# Patient Record
Sex: Male | Born: 1935 | Race: White | Hispanic: No | Marital: Married
Health system: Southern US, Community
[De-identification: ages and names within clinical notes are randomized; demographics above are authoritative.]

## PROBLEM LIST (undated history)

## (undated) DIAGNOSIS — J90 Pleural effusion, not elsewhere classified: Secondary | ICD-10-CM

## (undated) DIAGNOSIS — N183 Chronic kidney disease, stage 3 unspecified: Secondary | ICD-10-CM

## (undated) DIAGNOSIS — J9621 Acute and chronic respiratory failure with hypoxia: Secondary | ICD-10-CM

## (undated) DIAGNOSIS — I729 Aneurysm of unspecified site: Secondary | ICD-10-CM

## (undated) DIAGNOSIS — I482 Chronic atrial fibrillation, unspecified: Secondary | ICD-10-CM

---

## 2019-09-15 ENCOUNTER — Other Ambulatory Visit (HOSPITAL_COMMUNITY): Payer: Medicare Other

## 2019-09-15 ENCOUNTER — Inpatient Hospital Stay
Admission: AD | Admit: 2019-09-15 | Discharge: 2019-11-01 | Disposition: E | Payer: Medicare Other | Source: Other Acute Inpatient Hospital | Attending: Internal Medicine | Admitting: Internal Medicine

## 2019-09-15 DIAGNOSIS — Z4659 Encounter for fitting and adjustment of other gastrointestinal appliance and device: Secondary | ICD-10-CM

## 2019-09-15 DIAGNOSIS — I729 Aneurysm of unspecified site: Secondary | ICD-10-CM | POA: Diagnosis present

## 2019-09-15 DIAGNOSIS — I482 Chronic atrial fibrillation, unspecified: Secondary | ICD-10-CM | POA: Diagnosis present

## 2019-09-15 DIAGNOSIS — N183 Chronic kidney disease, stage 3 unspecified: Secondary | ICD-10-CM | POA: Diagnosis present

## 2019-09-15 DIAGNOSIS — J189 Pneumonia, unspecified organism: Secondary | ICD-10-CM

## 2019-09-15 DIAGNOSIS — T85598A Other mechanical complication of other gastrointestinal prosthetic devices, implants and grafts, initial encounter: Secondary | ICD-10-CM

## 2019-09-15 DIAGNOSIS — Z0189 Encounter for other specified special examinations: Secondary | ICD-10-CM

## 2019-09-15 DIAGNOSIS — I509 Heart failure, unspecified: Secondary | ICD-10-CM

## 2019-09-15 DIAGNOSIS — R0902 Hypoxemia: Secondary | ICD-10-CM

## 2019-09-15 DIAGNOSIS — T17908A Unspecified foreign body in respiratory tract, part unspecified causing other injury, initial encounter: Secondary | ICD-10-CM

## 2019-09-15 DIAGNOSIS — J90 Pleural effusion, not elsewhere classified: Secondary | ICD-10-CM | POA: Diagnosis present

## 2019-09-15 DIAGNOSIS — J9621 Acute and chronic respiratory failure with hypoxia: Secondary | ICD-10-CM | POA: Diagnosis present

## 2019-09-15 HISTORY — DX: Acute and chronic respiratory failure with hypoxia: J96.21

## 2019-09-15 HISTORY — DX: Chronic kidney disease, stage 3 unspecified: N18.30

## 2019-09-15 HISTORY — DX: Pleural effusion, not elsewhere classified: J90

## 2019-09-15 HISTORY — DX: Chronic atrial fibrillation, unspecified: I48.20

## 2019-09-15 HISTORY — DX: Aneurysm of unspecified site: I72.9

## 2019-09-16 LAB — COMPREHENSIVE METABOLIC PANEL
ALT: 27 U/L (ref 0–44)
AST: 27 U/L (ref 15–41)
Albumin: 1.2 g/dL — ABNORMAL LOW (ref 3.5–5.0)
Alkaline Phosphatase: 70 U/L (ref 38–126)
Anion gap: 7 (ref 5–15)
BUN: 31 mg/dL — ABNORMAL HIGH (ref 8–23)
CO2: 31 mmol/L (ref 22–32)
Calcium: 7.9 mg/dL — ABNORMAL LOW (ref 8.9–10.3)
Chloride: 103 mmol/L (ref 98–111)
Creatinine, Ser: 1.32 mg/dL — ABNORMAL HIGH (ref 0.61–1.24)
GFR calc Af Amer: 55 mL/min — ABNORMAL LOW (ref >60)
GFR calc non Af Amer: 48 mL/min — ABNORMAL LOW (ref >60)
Glucose, Bld: 82 mg/dL (ref 70–99)
Potassium: 4.8 mmol/L (ref 3.5–5.1)
Sodium: 141 mmol/L (ref 135–145)
Total Bilirubin: 0.5 mg/dL (ref 0.3–1.2)
Total Protein: 3.9 g/dL — ABNORMAL LOW (ref 6.5–8.1)

## 2019-09-16 LAB — CBC
HCT: 31.3 % — ABNORMAL LOW (ref 39.0–52.0)
Hemoglobin: 9.5 g/dL — ABNORMAL LOW (ref 13.0–17.0)
MCH: 30.7 pg (ref 26.0–34.0)
MCHC: 30.4 g/dL (ref 30.0–36.0)
MCV: 101.3 fL — ABNORMAL HIGH (ref 80.0–100.0)
Platelets: 166 10*3/uL (ref 150–400)
RBC: 3.09 MIL/uL — ABNORMAL LOW (ref 4.22–5.81)
RDW: 18.4 % — ABNORMAL HIGH (ref 11.5–15.5)
WBC: 5.2 10*3/uL (ref 4.0–10.5)
nRBC: 0 % (ref 0.0–0.2)

## 2019-09-16 NOTE — Consult Note (Signed)
Infectious Disease Consultation   Donald Alvarado  ZOX:[id number]  DOB: 10/11/1928  DOA: 05/22/19  Requesting Donald Sicphysician: Dr.Brown  Reason for consultation: Antibiotic recommendations   History of Present Illness: Patient has dementia, he is a poor historian.  Therefore history obtained from the outside records. Donald Alvarado is an 85 y.o. male with history of dementia, hypertension, atrial fibrillation, hyperlipidemia, hypothyroidism, peripheral arterial disease, bilateral common femoral aneurysms status post prior open repair of the ruptured CFA with PTFE bypass April 16, 2019 who transferred from [facility] to [facility] on 08/07/2019 with right groin bleeding.  Patient was taken to the OR on 08/08/2019 and underwent right groin incision and drainage followed by repeat I&D with wound VAC placement on 08/12/2019.  He was found with AFB positive culture.  He had a hematoma and persistent bleeding from the postoperative wound site.  He was taken to the OR on 08/14/2019 for right groin exploration due to bleeding.  Sartorius flap coverage was done at that time.  Infectious disease was consulted for concern of infected mycotic arterial aneurysm.  He was initially treated with Zosyn and vancomycin.  He also had other symptoms of sore throat and rhinorrhea.  Covid 19 test was negative on admission on 08/07/2019.  Patient was transferred to the ICU on 08/17/2019 following rapid response event for increased bleeding from the right groin surgical site.  He also had atrial fibrillation with RVR.  He was taken back to the OR on 08/18/2019 and underwent redo right external iliac artery to superficial femoral artery bypass with reversed saphenous vein and jump graft from femoral vein to SFA with reversed right greater saphenous vein.  He received multiple blood product transfusion for the acute blood loss anemia.  Regarding his A. fib with RVR cardiology was consulted and he was transitioned from amiodarone  drip to oral.  Patient also had dysphagia.  He reportedly failed multiple modified barium swallow studies and Dobbhoff tube was placed.  He developed left-sided pleural effusion requiring thoracentesis on 09/05/2019.  He is on oxygen by nasal cannula.  He also had fluid overload and diuresed with Lasix which was later stopped due to acute kidney injury.  Palliative care was consulted.  Family decided against placement of permanent feeding tube.  Patient has been on treatment with azithromycin, cefoxitin, tigecycline.  He has dementia, oriented x1.  Denies having any chest pain, shortness of breath, fevers, chills, nausea, vomiting, abdominal pain, diarrhea or dysuria at this time.   Review of Systems:  Review of system negative except as mentioned above in the HPI   Past Medical History: Hypertension, peripheral vascular disease, dementia, hypothyroidism  Past Surgical History: Open repair of right groin ruptured CFA with PTFE bypass April 16, 2019, subsequent incision and drainage on 08/08/2019, 08/12/2019, 08/14/2019.  Allergies: Please see MAR  Social History: Unable to obtain at this time  Family History: Unable to obtain at this time   Physical Exam: Vitals: Temperature 97.9, pulse 57, respiratory rate 20, blood pressure 165/83, pulse oximetry 92% on oxygen by nasal cannula General: Frail, ill-appearing male, awake, oriented x1 Head: Atraumatic, normocephalic Eyes: PERLA, EOMI, irises appear normal, anicteric sclera,  ENMT: external ears and nose appear normal, hard of hearing, no oral, ear or nose lesions, Dobbhoff tube in place            Neck: neck appears normal, no masses  CVS: S1-S2, no murmur  Respiratory: Decreased breath sound lower lobes, occasional rhonchi, no wheezing Abdomen: soft nontender, nondistended,  normal bowel sounds  Musculoskeletal: Healing surgical incision in the right groin area, has some swelling in the area, mild erythema, no tenderness, no drainage  noted at this time Neuro: He has history of dementia, oriented x1, able to move all his extremities, has some debility with generalized weakness Psych: Has dementia, stable mood and affect Skin: Findings as mentioned above, no rashes  Data reviewed:  I have personally reviewed following labs and imaging studies Labs:  CBC: Recent Labs  Lab 09/16/19 0507  WBC 5.2  HGB 9.5*  HCT 31.3*  MCV 101.3*  PLT 166    Basic Metabolic Panel: Recent Labs  Lab 09/16/19 0507  NA 141  K 4.8  CL 103  CO2 31  GLUCOSE 82  BUN 31*  CREATININE 1.32*  CALCIUM 7.9*   GFR CrCl cannot be calculated (Unknown ideal weight.). Liver Function Tests: Recent Labs  Lab 09/16/19 0507  AST 27  ALT 27  ALKPHOS 70  BILITOT 0.5  PROT 3.9*  ALBUMIN 1.2*   No results for input(s): LIPASE, AMYLASE in the last 168 hours. No results for input(s): AMMONIA in the last 168 hours. Coagulation profile No results for input(s): INR, PROTIME in the last 168 hours.  Cardiac Enzymes: No results for input(s): CKTOTAL, CKMB, CKMBINDEX, TROPONINI in the last 168 hours. BNP: Invalid input(s): POCBNP CBG: No results for input(s): GLUCAP in the last 168 hours. D-Dimer No results for input(s): DDIMER in the last 72 hours. Hgb A1c No results for input(s): HGBA1C in the last 72 hours. Lipid Profile No results for input(s): CHOL, HDL, LDLCALC, TRIG, CHOLHDL, LDLDIRECT in the last 72 hours. Thyroid function studies No results for input(s): TSH, T4TOTAL, T3FREE, THYROIDAB in the last 72 hours.  Invalid input(s): FREET3 Anemia work up No results for input(s): VITAMINB12, FOLATE, FERRITIN, TIBC, IRON, RETICCTPCT in the last 72 hours. Urinalysis No results found for: COLORURINE, APPEARANCEUR, LABSPEC, PHURINE, GLUCOSEU, HGBUR, BILIRUBINUR, KETONESUR, PROTEINUR, UROBILINOGEN, NITRITE, LEUKOCYTESUR   Microbiology No results found for this or any previous visit (from the past 240 hour(s)).     Inpatient  Medications:   Scheduled Meds: Continuous Infusions:   Radiological Exams on Admission: DG Abd 1 View  Result Date: 09/06/2019 CLINICAL DATA:  NG tube placement EXAM: ABDOMEN - 1 VIEW COMPARISON:  None. FINDINGS: Weighted feeding tube terminates in the mid gastric body. Nonobstructive bowel gas pattern. Small bilateral pleural effusions, left greater than right. IMPRESSION: Weighted feeding tube terminates in the mid gastric body. Electronically Signed   By: Charline Bills M.D.   On: 09/19/2019 20:27   DG Chest Port 1 View  Result Date: 09/29/2019 CLINICAL DATA:  Pleural effusion EXAM: PORTABLE CHEST 1 VIEW COMPARISON:  None FINDINGS: Cardiomegaly. Bilateral pleural effusions, moderate, left greater than right. Bibasilar atelectasis. Mild vascular congestion. No acute bony abnormality. Aortic atherosclerosis. IMPRESSION: Moderate bilateral pleural effusions, left greater than right with bibasilar atelectasis. Cardiomegaly, vascular congestion. Electronically Signed   By: Charlett Nose M.D.   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  He is status post multiple surgeries on 08/08/2019, 08/12/2019 and subsequently again on 08/14/2019.  Wound cultures showed Mycobacterium abscessus.  Currently the wound is closed, incision healing. On treatment with azithromycin, cefoxitin, tigecycline.   Continue current IV antibiotics.  He will need treatment for at least 2 weeks if not 3 weeks depending on improvement. Continue local wound care.  If he starts having any worsening pain or tenderness recommend CT imaging of the area.  Right groin abscess with infection: Status post multiple debridement surgeries as mentioned above.  Cultures reportedly showed Mycobacterium abscesses.  He is status post multiple debridements.  Antibiotics and plan as mentioned above.  Monitor BUN/creatinine closely while on antibiotics.  Infected mycotic aneurysm: As mentioned above he is status post multiple surgical debridements.  He was already treated with broad-spectrum IV vancomycin, Zosyn.  Cultures at the outside facility showed Mycobacterium abscesses.  Therefore on antibiotics as mentioned above.  Acute respiratory failure: This is postoperative.  Likely also has aspiration secondary to his dysphagia?  Chest x-ray from here showing moderate bilateral pleural effusions left greater than right with bibasilar atelectasis, cardiomegaly with vascular congestion.  Currently on oxygen by nasal cannula.  Denies having any cough or sputum production.  Management of vascular congestion and pleural effusion per the primary team.  Severe peripheral vascular disease: Continue medication and management per primary team.  Dysphagia: Patient failed swallow evaluation at the outside facility.  Currently has a Dobbhoff tube.  Unfortunately due to his dysphagia he is very high risk for ongoing aspiration and worsening respiratory failure from aspiration pneumonia.  Left-sided effusion: Patient already had thoracentesis at the outside facility.  Chest x-ray from here showing moderate bilateral pleural effusions left greater than right with atelectasis.  Suggest incentive spirometry with the patient able to tolerate.  If his respiratory status worsens would recommend to obtain chest imaging preferably chest CT without contrast to  better evaluate.  Further management of the pleural effusion per the primary team.  Atrial fibrillation: Continue medication and management per primary team.  Chronic kidney disease: Patient likely has stage III CKD.  He had acute renal insufficiency at the outside facility.  Please monitor BUN/trending closely while on antibiotics.  Avoid nephrotoxic medications.  Dementia/debility: Continue supportive therapy per the primary team.  Due to his complex medical problems he is high risk for worsening and decompensation.   Thank you for this consultation.   Yaakov Guthrie M.D. 09/16/2019, 2:46 PM

## 2019-09-20 LAB — BASIC METABOLIC PANEL
Anion gap: 7 (ref 5–15)
BUN: 38 mg/dL — ABNORMAL HIGH (ref 8–23)
CO2: 32 mmol/L (ref 22–32)
Calcium: 8.1 mg/dL — ABNORMAL LOW (ref 8.9–10.3)
Chloride: 103 mmol/L (ref 98–111)
Creatinine, Ser: 1.35 mg/dL — ABNORMAL HIGH (ref 0.61–1.24)
GFR calc Af Amer: 54 mL/min — ABNORMAL LOW (ref >60)
GFR calc non Af Amer: 47 mL/min — ABNORMAL LOW (ref >60)
Glucose, Bld: 100 mg/dL — ABNORMAL HIGH (ref 70–99)
Potassium: 5 mmol/L (ref 3.5–5.1)
Sodium: 142 mmol/L (ref 135–145)

## 2019-09-21 LAB — CBC
HCT: 31.5 % — ABNORMAL LOW (ref 39.0–52.0)
Hemoglobin: 9.9 g/dL — ABNORMAL LOW (ref 13.0–17.0)
MCH: 31.3 pg (ref 26.0–34.0)
MCHC: 31.4 g/dL (ref 30.0–36.0)
MCV: 99.7 fL (ref 80.0–100.0)
Platelets: 160 10*3/uL (ref 150–400)
RBC: 3.16 MIL/uL — ABNORMAL LOW (ref 4.22–5.81)
RDW: 18.3 % — ABNORMAL HIGH (ref 11.5–15.5)
WBC: 5.9 10*3/uL (ref 4.0–10.5)
nRBC: 0 % (ref 0.0–0.2)

## 2019-09-21 LAB — RENAL FUNCTION PANEL
Albumin: 1.3 g/dL — ABNORMAL LOW (ref 3.5–5.0)
Anion gap: 7 (ref 5–15)
BUN: 35 mg/dL — ABNORMAL HIGH (ref 8–23)
CO2: 32 mmol/L (ref 22–32)
Calcium: 8.1 mg/dL — ABNORMAL LOW (ref 8.9–10.3)
Chloride: 102 mmol/L (ref 98–111)
Creatinine, Ser: 1.16 mg/dL (ref 0.61–1.24)
GFR calc Af Amer: >60 mL/min (ref >60)
GFR calc non Af Amer: 56 mL/min — ABNORMAL LOW (ref >60)
Glucose, Bld: 98 mg/dL (ref 70–99)
Phosphorus: 3.6 mg/dL (ref 2.5–4.6)
Potassium: 4.5 mmol/L (ref 3.5–5.1)
Sodium: 141 mmol/L (ref 135–145)

## 2019-09-21 LAB — MAGNESIUM: Magnesium: 1.9 mg/dL (ref 1.7–2.4)

## 2019-09-22 NOTE — Progress Notes (Signed)
PROGRESS NOTE    Donald Alvarado  DQQ:[id number] DOB: [birth year] DOA: 2019/10/09  Brief Narrative:  Donald Alvarado is an 85 y.o. male with history of dementia, hypertension, atrial fibrillation, hyperlipidemia, hypothyroidism, peripheral arterial disease, bilateral common femoral aneurysms status post prior open repair of the ruptured CFA with PTFE bypass April 16, 2019 who transferred from [facility] to [facility] on 08/07/2019 with right groin bleeding.  Patient was taken to the OR on 08/08/2019 and underwent right groin incision and drainage followed by repeat I&D with wound VAC placement on 08/12/2019.  He was found with AFB positive culture.  He had a hematoma and persistent bleeding from the postoperative wound site.  He was taken to the OR on 08/14/2019 for right groin exploration due to bleeding.  Sartorius flap coverage was done at that time.  Infectious disease was consulted for concern of infected mycotic arterial aneurysm.  He was initially treated with Zosyn and vancomycin.  He also had other symptoms of sore throat and rhinorrhea.  Covid 19 test was negative on admission on 08/07/2019.  Patient was transferred to the ICU on 08/17/2019 following rapid response event for increased bleeding from the right groin surgical site.  He also had atrial fibrillation with RVR.  He was taken back to the OR on 08/18/2019 and underwent redo right external iliac artery to superficial femoral artery bypass with reversed saphenous vein and jump graft from femoral vein to SFA with reversed right greater saphenous vein.  He received multiple blood product transfusion for the acute blood loss anemia.  Regarding his A. fib with RVR cardiology was consulted and he was transitioned from amiodarone drip to oral.  Patient also had dysphagia.  He reportedly failed multiple modified barium swallow studies and Dobbhoff tube was placed.  He developed left-sided pleural effusion requiring thoracentesis on 09/05/2019.  He is on  oxygen by nasal cannula.  He also had fluid overload and diuresed with Lasix which was later stopped due to acute kidney injury.  Palliative care was consulted.  Family decided against placement of permanent feeding tube.  Patient on treatment with azithromycin, cefoxitin, tigecycline.    09/22/2019: He has dementia, currently oriented x2.  Denies having any chest pain, shortness of breath, fevers, chills, nausea, vomiting, abdominal pain, diarrhea or dysuria at this time.   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  He is status post multiple surgeries on 08/08/2019, 08/12/2019 and subsequently again on 08/14/2019.  Wound cultures showed Mycobacterium abscessus.  Currently the wound is closed, incision healing. On treatment with azithromycin, cefoxitin, tigecycline.   09/22/2019: His surgical incision is healing, he has a scab but on palpation he has mild purulence from around the scab.  Therefore recommend to continue current IV antibiotics.  He will need treatment for at least 2 weeks if not 3 weeks depending on improvement. Continue local wound care.  If he starts having any worsening pain or tenderness recommend CT imaging of the area.  Right groin abscess with infection: Status post multiple debridement surgeries  as mentioned above.  Cultures reportedly showed Mycobacterium abscesses.  He is status post multiple debridements.   Antibiotics and plan as mentioned above.  Monitor BUN/creatinine closely while on antibiotics.  Infected mycotic aneurysm: As mentioned above he is status post multiple surgical debridements.  He was already treated with broad-spectrum IV vancomycin, Zosyn.  Cultures at the outside facility showed Mycobacterium abscesses.  Therefore on antibiotics as mentioned above.  Acute respiratory failure: This is postoperative.  Likely also has aspiration secondary to his dysphagia?  Chest x-ray from here showing moderate bilateral pleural effusions left greater than right with bibasilar atelectasis, cardiomegaly with vascular congestion.  Currently on oxygen by nasal cannula.  Denies having any cough or sputum production.  Management of vascular congestion and pleural effusion per the primary team.  Severe peripheral vascular disease: Continue medication and management per primary team.  Dysphagia: Patient failed swallow evaluation at the outside facility.  Currently has a Dobbhoff tube.  Unfortunately due to his dysphagia he is very high risk for ongoing aspiration and worsening respiratory failure from aspiration pneumonia.  Left-sided effusion: Patient already had thoracentesis at the outside facility.  Chest x-ray from here showing moderate bilateral pleural effusions left greater than right with atelectasis.  Suggest incentive spirometry with the patient able to tolerate.  If his respiratory status worsens, would recommend to obtain chest imaging preferably chest CT without contrast to better evaluate.  Further management of the pleural effusion per the primary team.  Atrial fibrillation: Continue medication and management per primary team.  Chronic kidney disease: Patient likely has stage III CKD.  He had acute renal insufficiency at the outside facility.  Please monitor BUN/trending closely while on antibiotics.  Avoid nephrotoxic medications.  Dementia/debility: Continue supportive therapy per  the primary team.  Bradycardia:  Continue medications and management per the primary team.  Due to his complex medical problems he is high risk for worsening and decompensation.  Subjective: He denies having any major complaints at this time.  His lower extremity surgical site still a little bit swollen, has scab but mild purulence on palpation.  Continues with the Dobbhoff tube.  Objective: Temperature 96.8, pulse 51, respiratory 19, blood pressure 201/84, pulse oximetry 98% on oxygen by nasal cannula.  Examination:  General: Frail, ill-appearing male, awake, oriented x2 Head: Atraumatic, normocephalic Eyes: PERLA, EOMI, irises appear normal, anicteric sclera,  ENMT: external ears and nose appear normal, hard of hearing, no oral, ear or nose lesions, Dobbhoff tube in place            Neck: neck appears normal, no masses  CVS: S1-S2, no murmur  Respiratory: Decreased breath sound lower lobes, occasional rhonchi, no wheezing Abdomen: soft nontender, nondistended, normal bowel sounds  Musculoskeletal: Healing surgical incision in the right groin area with a scab.  On palpation he has mild purulence coming from around the scab. has some swelling in the area, no erythema, no tenderness Neuro: He has history of dementia, oriented x1, able to move all his extremities, has some debility with generalized weakness, lower extremity edema. Psych: Has dementia, stable mood and affect Skin: Findings as mentioned above, no rashes   Data Reviewed: I have personally reviewed following labs and imaging studies  CBC: Recent Labs  Lab 09/16/19 0507 09/21/19 0515  WBC 5.2 5.9  HGB 9.5* 9.9*  HCT 31.3* 31.5*  MCV 101.3* 99.7  PLT 166 354   Basic Metabolic Panel: Recent Labs  Lab 09/16/19 0507 09/20/19 0717 09/21/19 0515  NA 141 142  141  K 4.8 5.0 4.5  CL 103 103 102  CO2 31 32 32  GLUCOSE 82 100* 98  BUN 31* 38* 35*  CREATININE 1.32* 1.35* 1.16  CALCIUM 7.9* 8.1* 8.1*  MG  --   --   1.9  PHOS  --   --  3.6   GFR: CrCl cannot be calculated (Unknown ideal weight.). Liver Function Tests: Recent Labs  Lab 09/16/19 0507 09/21/19 0515  AST 27  --   ALT 27  --   ALKPHOS 70  --   BILITOT 0.5  --   PROT 3.9*  --   ALBUMIN 1.2* 1.3*   No results for input(s): LIPASE, AMYLASE in the last 168 hours. No results for input(s): AMMONIA in the last 168 hours. Coagulation Profile: No results for input(s): INR, PROTIME in the last 168 hours. Cardiac Enzymes: No results for input(s): CKTOTAL, CKMB, CKMBINDEX, TROPONINI in the last 168 hours. BNP (last 3 results) No results for input(s): PROBNP in the last 8760 hours. HbA1C: No results for input(s): HGBA1C in the last 72 hours. CBG: No results for input(s): GLUCAP in the last 168 hours. Lipid Profile: No results for input(s): CHOL, HDL, LDLCALC, TRIG, CHOLHDL, LDLDIRECT in the last 72 hours. Thyroid Function Tests: No results for input(s): TSH, T4TOTAL, FREET4, T3FREE, THYROIDAB in the last 72 hours. Anemia Panel: No results for input(s): VITAMINB12, FOLATE, FERRITIN, TIBC, IRON, RETICCTPCT in the last 72 hours. Sepsis Labs: No results for input(s): PROCALCITON, LATICACIDVEN in the last 168 hours.  No results found for this or any previous visit (from the past 240 hour(s)).       Radiology Studies: No results found.      Scheduled Meds: Please see MAR   Vonzella NippleAnupama Gabriell Daigneault, MD 09/22/2019, 12:35 PM

## 2019-09-23 ENCOUNTER — Other Ambulatory Visit (HOSPITAL_COMMUNITY): Payer: Medicare Other

## 2019-09-25 LAB — RENAL FUNCTION PANEL
Albumin: 1.4 g/dL — ABNORMAL LOW (ref 3.5–5.0)
Anion gap: 6 (ref 5–15)
BUN: 42 mg/dL — ABNORMAL HIGH (ref 8–23)
CO2: 31 mmol/L (ref 22–32)
Calcium: 8.1 mg/dL — ABNORMAL LOW (ref 8.9–10.3)
Chloride: 102 mmol/L (ref 98–111)
Creatinine, Ser: 1.38 mg/dL — ABNORMAL HIGH (ref 0.61–1.24)
GFR calc Af Amer: 53 mL/min — ABNORMAL LOW (ref >60)
GFR calc non Af Amer: 45 mL/min — ABNORMAL LOW (ref >60)
Glucose, Bld: 113 mg/dL — ABNORMAL HIGH (ref 70–99)
Phosphorus: 3.5 mg/dL (ref 2.5–4.6)
Potassium: 4.4 mmol/L (ref 3.5–5.1)
Sodium: 139 mmol/L (ref 135–145)

## 2019-09-25 LAB — CBC
HCT: 32.3 % — ABNORMAL LOW (ref 39.0–52.0)
Hemoglobin: 10.4 g/dL — ABNORMAL LOW (ref 13.0–17.0)
MCH: 31.3 pg (ref 26.0–34.0)
MCHC: 32.2 g/dL (ref 30.0–36.0)
MCV: 97.3 fL (ref 80.0–100.0)
Platelets: 180 10*3/uL (ref 150–400)
RBC: 3.32 MIL/uL — ABNORMAL LOW (ref 4.22–5.81)
RDW: 18.4 % — ABNORMAL HIGH (ref 11.5–15.5)
WBC: 6.8 10*3/uL (ref 4.0–10.5)
nRBC: 0 % (ref 0.0–0.2)

## 2019-09-25 LAB — MAGNESIUM: Magnesium: 2 mg/dL (ref 1.7–2.4)

## 2019-09-26 ENCOUNTER — Other Ambulatory Visit (HOSPITAL_COMMUNITY): Payer: Medicare Other

## 2019-09-27 ENCOUNTER — Other Ambulatory Visit (HOSPITAL_COMMUNITY): Payer: Medicare Other

## 2019-09-28 LAB — CBC
HCT: 34.7 % — ABNORMAL LOW (ref 39.0–52.0)
Hemoglobin: 11 g/dL — ABNORMAL LOW (ref 13.0–17.0)
MCH: 31.6 pg (ref 26.0–34.0)
MCHC: 31.7 g/dL (ref 30.0–36.0)
MCV: 99.7 fL (ref 80.0–100.0)
Platelets: 172 10*3/uL (ref 150–400)
RBC: 3.48 MIL/uL — ABNORMAL LOW (ref 4.22–5.81)
RDW: 18.5 % — ABNORMAL HIGH (ref 11.5–15.5)
WBC: 5.9 10*3/uL (ref 4.0–10.5)
nRBC: 0 % (ref 0.0–0.2)

## 2019-09-28 LAB — BASIC METABOLIC PANEL
Anion gap: 7 (ref 5–15)
BUN: 43 mg/dL — ABNORMAL HIGH (ref 8–23)
CO2: 32 mmol/L (ref 22–32)
Calcium: 8.4 mg/dL — ABNORMAL LOW (ref 8.9–10.3)
Chloride: 101 mmol/L (ref 98–111)
Creatinine, Ser: 1.37 mg/dL — ABNORMAL HIGH (ref 0.61–1.24)
GFR calc Af Amer: 53 mL/min — ABNORMAL LOW (ref >60)
GFR calc non Af Amer: 46 mL/min — ABNORMAL LOW (ref >60)
Glucose, Bld: 107 mg/dL — ABNORMAL HIGH (ref 70–99)
Potassium: 4.7 mmol/L (ref 3.5–5.1)
Sodium: 140 mmol/L (ref 135–145)

## 2019-09-28 LAB — MAGNESIUM: Magnesium: 1.9 mg/dL (ref 1.7–2.4)

## 2019-09-30 ENCOUNTER — Other Ambulatory Visit (HOSPITAL_COMMUNITY): Payer: Medicare Other

## 2019-09-30 NOTE — Progress Notes (Signed)
PROGRESS NOTE    Donald Alvarado  ZTI:[id number] DOB: [birth year] DOA: 09/10/2019   Brief Narrative:  Donald Alvarado an 85 y.o.malewith history of dementia, hypertension, atrial fibrillation, hyperlipidemia, hypothyroidism, peripheral arterial disease, bilateral common femoral aneurysms status post prior open repair of the ruptured CFA with PTFE bypass April 16, 2019 who transferred from [facility] to [facility] on 08/07/2019 with right groin bleeding. Patient was taken to the OR on 08/08/2019 and underwent right groin incision and drainage followed by repeat I&D with wound VAC placement on 08/12/2019. He was found with AFB positive culture. He had a hematoma and persistent bleeding from the postoperative wound site. He was taken to the OR on 08/14/2019 for right groin exploration due to bleeding. Sartorius flap coverage was done at that time. Infectious disease was consulted for concern of infected mycotic arterial aneurysm. He was initially treated with Zosyn and vancomycin. He also had other symptoms of sore throat and rhinorrhea. Covid 19 test was negative on admission on 08/07/2019. Patient was transferred to the ICU on 08/17/2019 following rapid response event for increased bleeding from the right groin surgical site. He also had atrial fibrillation with RVR. He was taken back to the OR on 08/18/2019 and underwent redo right external iliac artery to superficial femoral artery bypass with reversed saphenous vein and jump graft from femoral vein to SFA with reversed right greater saphenous vein. He received multiple blood product transfusion for the acute blood loss anemia. Regarding his A. fib with RVR cardiology was consulted and he was transitioned from amiodarone drip to oral. Patient also had dysphagia. He reportedly failed multiple modified barium swallow studies and Dobbhoff tube was placed. He developed left-sided pleural effusion requiring thoracentesis on 09/05/2019. He is on  oxygen by nasal cannula. He also had fluid overload and diuresed with Lasix which was later stopped due to acute kidney injury. Palliative care was consulted. Family decided against placement of permanent feeding tube. Patient on treatment with azithromycin, cefoxitin, tigecycline.   09/30/2019: He is confused.  He has history of dementia.  He was started on diet after speech/swallow evaluation.  However, today noted to have coughing, gurgling sounds.  Likely aspirating?  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 He is status post multiple surgeries on 08/08/2019, 08/12/2019 and subsequently again on 08/14/2019. Wound cultures showed Mycobacterium abscessus. Currently the wound is closed. On treatment with azithromycin, cefoxitin, tigecycline.  09/30/2019: His surgical incision is healing, previously had a scab.  Currently he has dressing in place with some mild drainage.  Recommend to continue current IV antibiotics.  We will plan to treat for another week and reevaluate the wound depending on improvement. Continue local wound care.  He is having some swelling on the medial aspect of the same thigh likely secondary to hematoma.  However, nontender at this time. If he starts having any worsening pain or tenderness recommend either ultrasound or CT imaging of the area.  Right groin abscess with infection: Status post multiple  debridement surgeries as mentioned above. Cultures reportedly  showed Mycobacterium abscesses. He is status post multiple debridements. Antibiotics and plan as mentioned above. Monitor BUN/creatinine closely while on antibiotics.  Infected mycotic aneurysm: As mentioned above he is status post multiple surgical debridements. He was already treated with broad-spectrum IV vancomycin, Zosyn. Cultures at the outside facility showed Mycobacterium abscesses. Therefore on antibiotics as mentioned above.  Acute respiratory failure: Likely also has aspiration secondary to his dysphagia? Previous chest x-ray showed moderate bilateral pleural effusions left greater than right with bibasilar atelectasis, cardiomegaly with vascular congestion. Management of vascular congestion and pleural effusion per the primary team.  Severe peripheral vascular disease: Continue medication and management per primary team.  Dysphagia: Patient failed swallow evaluation at the outside facility.  Previously had Dobbhoff tube.  After swallow evaluation here he was started on diet.  However, today noted to have some cough and gurgling.  Very high suspicion for aspiration.  Chest x-ray being ordered. Unfortunately due to his dysphagia he is very high risk for ongoing aspiration and worsening respiratory failure from aspiration pneumonia.  Left-sided effusion: Patient already had thoracentesis at the outside facility.  Previous chest x-ray from here showed moderate bilateral pleural effusions left greater than right with atelectasis.  Repeat chest x-ray being ordered.  Atrial fibrillation: Continue medication and management per primary team.  Chronic kidney disease: Patient likely has stage III CKD. He had acute renal insufficiency at the outside facility. Please monitor BUN/trending closely while on antibiotics. Avoid nephrotoxic medications.  Dementia/debility: Continue supportive therapy per the primary  team.  Bradycardia:  Continue medications and management per the primary team.  Due to his complex medical problems he is high risk for worsening and decompensation.  Subjective: He is confused.  Having some cough/gurgling.  Concern for aspiration.  Objective: Temperature 97.3, pulse 85, respiratory 20, blood pressure 151/18, pulse oximetry 95%  Examination: General:Frail, ill-appearing male, awake, oriented x2 Head:Atraumatic, normocephalic Eyes: PERLA, EOMI, irises appear normal, anicteric sclera,  ENMT: external ears and nose appear normal,hard of hearing, nooral,ear or nose lesions, Dobbhoff tube in place Neck:neck appears normal, no masses  CVS: S1-S2, no murmur Respiratory:Decreased breath sound lower lobes, left greater than right, rhonchi, no wheezing Abdomen: soft nontender, nondistended, normal bowel sounds  Musculoskeletal:Healing surgical incision in the right groin area with mild drainage.  has some swelling on the medial aspect, no erythema, no tenderness Neuro:He has history of dementia, able to move all his extremities, has some debility with generalized weakness, lower extremity edema. Psych:Has dementia, stable mood and affect Skin:Findings as mentioned above, no rashes    Data Reviewed: I have personally reviewed following labs and imaging studies  CBC: Recent Labs  Lab 09/25/19 0859 09/28/19 1004  WBC 6.8 5.9  HGB 10.4* 11.0*  HCT 32.3* 34.7*  MCV 97.3 99.7  PLT 180 818   Basic Metabolic Panel: Recent Labs  Lab 09/25/19 0859 09/28/19 1004  NA 139 140  K 4.4 4.7  CL 102 101  CO2 31 32  GLUCOSE 113* 107*  BUN 42* 43*  CREATININE 1.38* 1.37*  CALCIUM 8.1* 8.4*  MG 2.0 1.9  PHOS 3.5  --    GFR: CrCl cannot be calculated (Unknown ideal weight.). Liver Function Tests: Recent Labs  Lab 09/25/19 0859  ALBUMIN 1.4*   No results for input(s): LIPASE, AMYLASE in the last 168 hours. No results for input(s): AMMONIA in  the last 168 hours. Coagulation Profile: No results for input(s): INR, PROTIME in the last 168 hours. Cardiac Enzymes: No results for input(s): CKTOTAL,  CKMB, CKMBINDEX, TROPONINI in the last 168 hours. BNP (last 3 results) No results for input(s): PROBNP in the last 8760 hours. HbA1C: No results for input(s): HGBA1C in the last 72 hours. CBG: No results for input(s): GLUCAP in the last 168 hours. Lipid Profile: No results for input(s): CHOL, HDL, LDLCALC, TRIG, CHOLHDL, LDLDIRECT in the last 72 hours. Thyroid Function Tests: No results for input(s): TSH, T4TOTAL, FREET4, T3FREE, THYROIDAB in the last 72 hours. Anemia Panel: No results for input(s): VITAMINB12, FOLATE, FERRITIN, TIBC, IRON, RETICCTPCT in the last 72 hours. Sepsis Labs: No results for input(s): PROCALCITON, LATICACIDVEN in the last 168 hours.  No results found for this or any previous visit (from the past 240 hour(s)).       Radiology Studies: No results found.      Scheduled Meds: Please see MAR    Vonzella NippleAnupama Zyire Eidson, MD 09/30/2019, 12:26 PM

## 2019-10-01 LAB — URINALYSIS, ROUTINE W REFLEX MICROSCOPIC
Bilirubin Urine: NEGATIVE
Glucose, UA: NEGATIVE mg/dL
Ketones, ur: 5 mg/dL — AB
Leukocytes,Ua: NEGATIVE
Nitrite: NEGATIVE
Protein, ur: 30 mg/dL — AB
Specific Gravity, Urine: 1.017 (ref 1.005–1.030)
pH: 5 (ref 5.0–8.0)

## 2019-10-01 LAB — CBC
HCT: 34 % — ABNORMAL LOW (ref 39.0–52.0)
Hemoglobin: 10.9 g/dL — ABNORMAL LOW (ref 13.0–17.0)
MCH: 31.2 pg (ref 26.0–34.0)
MCHC: 32.1 g/dL (ref 30.0–36.0)
MCV: 97.4 fL (ref 80.0–100.0)
Platelets: 161 10*3/uL (ref 150–400)
RBC: 3.49 MIL/uL — ABNORMAL LOW (ref 4.22–5.81)
RDW: 18 % — ABNORMAL HIGH (ref 11.5–15.5)
WBC: 15.6 10*3/uL — ABNORMAL HIGH (ref 4.0–10.5)
nRBC: 0 % (ref 0.0–0.2)

## 2019-10-01 LAB — MAGNESIUM: Magnesium: 1.8 mg/dL (ref 1.7–2.4)

## 2019-10-01 LAB — BASIC METABOLIC PANEL
Anion gap: 11 (ref 5–15)
BUN: 38 mg/dL — ABNORMAL HIGH (ref 8–23)
CO2: 28 mmol/L (ref 22–32)
Calcium: 8 mg/dL — ABNORMAL LOW (ref 8.9–10.3)
Chloride: 104 mmol/L (ref 98–111)
Creatinine, Ser: 1.63 mg/dL — ABNORMAL HIGH (ref 0.61–1.24)
GFR calc Af Amer: 43 mL/min — ABNORMAL LOW (ref >60)
GFR calc non Af Amer: 37 mL/min — ABNORMAL LOW (ref >60)
Glucose, Bld: 77 mg/dL (ref 70–99)
Potassium: 3.9 mmol/L (ref 3.5–5.1)
Sodium: 143 mmol/L (ref 135–145)

## 2019-10-01 DEATH — deceased

## 2019-10-02 ENCOUNTER — Other Ambulatory Visit (HOSPITAL_COMMUNITY): Payer: Medicare Other

## 2019-10-02 LAB — BASIC METABOLIC PANEL
Anion gap: 8 (ref 5–15)
BUN: 38 mg/dL — ABNORMAL HIGH (ref 8–23)
CO2: 29 mmol/L (ref 22–32)
Calcium: 7.9 mg/dL — ABNORMAL LOW (ref 8.9–10.3)
Chloride: 106 mmol/L (ref 98–111)
Creatinine, Ser: 1.41 mg/dL — ABNORMAL HIGH (ref 0.61–1.24)
GFR calc Af Amer: 51 mL/min — ABNORMAL LOW (ref >60)
GFR calc non Af Amer: 44 mL/min — ABNORMAL LOW (ref >60)
Glucose, Bld: 87 mg/dL (ref 70–99)
Potassium: 3.9 mmol/L (ref 3.5–5.1)
Sodium: 143 mmol/L (ref 135–145)

## 2019-10-02 LAB — URINE CULTURE: Culture: NO GROWTH

## 2019-10-04 ENCOUNTER — Encounter: Payer: Self-pay | Admitting: Internal Medicine

## 2019-10-04 ENCOUNTER — Other Ambulatory Visit (HOSPITAL_COMMUNITY): Payer: Medicare Other

## 2019-10-04 DIAGNOSIS — J90 Pleural effusion, not elsewhere classified: Secondary | ICD-10-CM

## 2019-10-04 DIAGNOSIS — I482 Chronic atrial fibrillation, unspecified: Secondary | ICD-10-CM

## 2019-10-04 DIAGNOSIS — J9621 Acute and chronic respiratory failure with hypoxia: Secondary | ICD-10-CM | POA: Diagnosis not present

## 2019-10-04 DIAGNOSIS — I729 Aneurysm of unspecified site: Secondary | ICD-10-CM

## 2019-10-04 DIAGNOSIS — N183 Chronic kidney disease, stage 3 unspecified: Secondary | ICD-10-CM

## 2019-10-04 DIAGNOSIS — I5089 Other heart failure: Secondary | ICD-10-CM

## 2019-10-04 LAB — BASIC METABOLIC PANEL
Anion gap: 8 (ref 5–15)
BUN: 37 mg/dL — ABNORMAL HIGH (ref 8–23)
CO2: 30 mmol/L (ref 22–32)
Calcium: 7.5 mg/dL — ABNORMAL LOW (ref 8.9–10.3)
Chloride: 106 mmol/L (ref 98–111)
Creatinine, Ser: 1.55 mg/dL — ABNORMAL HIGH (ref 0.61–1.24)
GFR calc Af Amer: 46 mL/min — ABNORMAL LOW (ref >60)
GFR calc non Af Amer: 39 mL/min — ABNORMAL LOW (ref >60)
Glucose, Bld: 87 mg/dL (ref 70–99)
Potassium: 3.2 mmol/L — ABNORMAL LOW (ref 3.5–5.1)
Sodium: 144 mmol/L (ref 135–145)

## 2019-10-04 LAB — CBC
HCT: 34.8 % — ABNORMAL LOW (ref 39.0–52.0)
Hemoglobin: 11.2 g/dL — ABNORMAL LOW (ref 13.0–17.0)
MCH: 31.6 pg (ref 26.0–34.0)
MCHC: 32.2 g/dL (ref 30.0–36.0)
MCV: 98.3 fL (ref 80.0–100.0)
Platelets: 168 10*3/uL (ref 150–400)
RBC: 3.54 MIL/uL — ABNORMAL LOW (ref 4.22–5.81)
RDW: 18.4 % — ABNORMAL HIGH (ref 11.5–15.5)
WBC: 12.7 10*3/uL — ABNORMAL HIGH (ref 4.0–10.5)
nRBC: 0 % (ref 0.0–0.2)

## 2019-10-04 LAB — MAGNESIUM: Magnesium: 2 mg/dL (ref 1.7–2.4)

## 2019-10-04 LAB — SARS CORONAVIRUS 2 (TAT 6-24 HRS): SARS Coronavirus 2: NEGATIVE

## 2019-10-04 NOTE — Consult Note (Signed)
Pulmonary Critical Care Medicine [facility] [place]  PULMONARY SERVICE  Date of Service: 10/04/2019  PULMONARY CRITICAL CARE CONSULT   ZUBIN PONTILLO  LFY:[id number]  DOB: [birth year]   DOA: 09/11/2019  Referring Physician: Carron Curie, MD  HPI: Donald Alvarado is a 86 y.o. male seen for follow up of Acute on Chronic Respiratory Failure.  Patient has multiple medical problems including hypertension atrial fibrillation hyperlipidemia dementia hypothyroidism peripheral vascular disease who came into the hospital because of bleeding from his groin.  Patient was taken to the OR for repair and drainage.  Postoperatively came up with a wound VAC.  Apparently at that time patient also was found to have aortic cultures that were positive.  It was felt the patient might have an infection of his aneurysm and was treated with Zosyn as well as vancomycin.  Patient subsequently has been having some issues with oxygenation.  Most recent chest x-ray shows worsening and is more consistent with congestive heart failure.  Review of Systems:  ROS performed and is unremarkable other than noted above.  Past Medical History:  Diagnosis Date  . Allergy  . Arthritis  . 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 Bladder cancer (HCC) 07/29/2017  . Cataract  . CHF (congestive heart failure) (HCC)  . Depression  . GERD (gastroesophageal reflux disease)  . Heart murmur  . Hypercholesterolemia  . Hypertension  . Myocardial infarction (HCC)  . Neuromuscular disorder (HCC)  . Status post abdominal aortic aneurysm repair 01/31/2012  Overview: Dr. Glyn Ade, endograft repair  . Status post coronary artery bypass grafting 10/27/2014  . Stroke (HCC)  . Thyroid disease  . Transfusion history   Baseline Functional Status with ADLs (bathing, feeding, dressing, toileting and mobility): Needs assistance of another person for at least one   Past Surgical History:  Procedure  Laterality Date  . ASCENDING AORTIC ANEURYSM REPAIR  . CORONARY ARTERY BYPASS GRAFT  . HERNIA REPAIR 2008  . JOINT REPLACEMENT  . SPINE SURGERY   Social History   Social History  . Marital status: Married  Spouse name: N/A  . Number of children: N/A  . Years of education: N/A   Occupational History  . Not on file.   Social History Main Topics  . Smoking status: Former Games developer  . Smokeless tobacco: Never Used  . Alcohol use No  . Drug use: No  . Sexual activity: Not on file   Allergies  Allergen Reactions  . Aspirin Anaphylaxis (ALLERGY)  . Amlodipine Other (See Comments)     Medications: Reviewed on Rounds  Physical Exam:  Vitals: Temperature 98.0 pulse 87 respiratory 28 blood pressure 142/71 saturations 97%  Ventilator Settings BiPAP 16/8 FiO2 is 100%  . General: Comfortable at this time . Eyes: Grossly normal lids, irises & conjunctiva . ENT: grossly tongue is normal . Neck: no obvious mass . Cardiovascular: S1-S2 normal no gallop or rub . Respiratory: No rhonchi no rales are noted at this time . Abdomen: Soft and nontender . Skin: no rash seen on limited exam . Musculoskeletal: not rigid . Psychiatric:unable to assess . Neurologic: no seizure no involuntary movements         Labs on Admission:  Basic Metabolic Panel: Recent Labs  Lab 09/28/19 1004 10/01/19 0504 10/02/19 1418 10/04/19 0734  NA 140 143 143 144  K 4.7 3.9 3.9 3.2*  CL 101 104 106 106  CO2 32 28 29 30   GLUCOSE 107* 77 87 87  BUN 43* 38* 38* 37*  CREATININE 1.37* 1.63* 1.41* 1.55*  CALCIUM 8.4* 8.0* 7.9* 7.5*  MG 1.9 1.8  --  2.0    No results for input(s): PHART, PCO2ART, PO2ART, HCO3, O2SAT in the last 168 hours.  Liver Function Tests: No results for input(s): AST, ALT, ALKPHOS, BILITOT, PROT, ALBUMIN in the last 168 hours. No results for input(s): LIPASE, AMYLASE in the last 168 hours. No results for input(s): AMMONIA in the last 168 hours.  CBC: Recent Labs  Lab  09/28/19 1004 10/01/19 0504 10/04/19 0734  WBC 5.9 15.6* 12.7*  HGB 11.0* 10.9* 11.2*  HCT 34.7* 34.0* 34.8*  MCV 99.7 97.4 98.3  PLT 172 161 168    Cardiac Enzymes: No results for input(s): CKTOTAL, CKMB, CKMBINDEX, TROPONINI in the last 168 hours.  BNP (last 3 results) No results for input(s): BNP in the last 8760 hours.  ProBNP (last 3 results) No results for input(s): PROBNP in the last 8760 hours.   Radiological Exams on Admission: DG Abd 1 View  Result Date: 10/02/2019 CLINICAL DATA:  Check gastric catheter placement EXAM: ABDOMEN - 1 VIEW COMPARISON:  09/26/2019 FINDINGS: Gastric catheter is noted within the stomach. No obstructive changes are seen. Patchy infiltrate is noted throughout the right lung. IMPRESSION: Gastric catheter within the stomach. Patchy infiltrate throughout the right lung stable from recent chest x-ray. Electronically Signed   By: Alcide Clever M.D.   On: 10/02/2019 13:18   DG CHEST PORT 1 VIEW  Result Date: 10/04/2019 CLINICAL DATA:  Pneumonia EXAM: PORTABLE CHEST 1 VIEW COMPARISON:  The October 02, 2019 FINDINGS: There is a nasogastric tube with tip and side port below the diaphragm. There is patchy airspace opacity throughout the lungs bilaterally, similar to 1 day prior. There are pleural effusions bilaterally, larger on the left than on the right. No new opacity evident. There is cardiomegaly with pulmonary vascularity within normal limits. There is evidence of previous coronary artery bypass grafting. There is aortic atherosclerosis. No adenopathy. No bone lesions. IMPRESSION: Nasogastric tube present. Multifocal airspace opacity, similar to 2 days prior. Question pneumonia versus edema. Both entities may be present concurrently. There is stable cardiomegaly. Postoperative change noted. Aortic Atherosclerosis (ICD10-I70.0). Except for the presence of the nasogastric tube, study essentially stable compared to 2 days prior. Electronically Signed   By: Bretta Bang III M.D.   On: 10/04/2019 07:38   DG CHEST PORT 1 VIEW  Result Date: 10/02/2019 CLINICAL DATA:  Pleural effusion and respiratory distress EXAM: PORTABLE CHEST 1 VIEW COMPARISON:  09/30/2019 FINDINGS: Mild cardiac enlargement. No change in bilateral pleural effusions, left greater than right. New multifocal interstitial opacities throughout the right lung are identified. Status post CABG procedure. Aortic atherosclerosis. IMPRESSION: 1. No change in bilateral pleural effusions, left greater than right. 2. New multifocal interstitial opacities throughout the right lung which may represent asymmetric edema or infection. Electronically Signed   By: Signa Kell M.D.   On: 10/02/2019 07:22    Assessment/Plan Active Problems:   Acute on chronic respiratory failure with hypoxia (HCC)   Pleural effusion, left   Mycotic aneurysm (HCC)   Chronic kidney disease, stage III (moderate)   Chronic atrial fibrillation (HCC)   1. Acute on chronic respiratory failure with hypoxia patient apparently is DNR however now is on BiPAP with severe hypoxia.  Patient has been requiring 100% FiO2.  Chest x-ray that was done is more consistent with heart failure and I did discuss the findings with the primary  care team try diuretics to see if this will help to improve his pulmonary status. 2. Left-sided pleural effusion on the chest x-ray patient still has a significant effusion noted on the left side would again try diuretics first if no improvement and if patient is stable then would consider possibly a thoracentesis 3. Chronic atrial fibrillation right now rate is controlled we will continue to monitor closely 4. Chronic kidney disease stage III follow labs closely current creatinine is 1.55 which is slightly elevated. 5. Infected mycotic aneurysm patient's antibiotics  I have personally seen and evaluated the patient, evaluated laboratory and imaging results, formulated the assessment and plan and placed  orders. The Patient requires high complexity decision making with multiple systems involvement.  Case was discussed on Rounds with the Respiratory Therapy Director and the Respiratory staff Time Spent 30minutes  Saadat A Khan, MD [facility] Pulmonary Critical Care Medicine Sleep Medicine

## 2019-10-05 ENCOUNTER — Other Ambulatory Visit (HOSPITAL_COMMUNITY): Payer: Medicare Other

## 2019-10-05 DIAGNOSIS — J9621 Acute and chronic respiratory failure with hypoxia: Secondary | ICD-10-CM | POA: Diagnosis not present

## 2019-10-05 DIAGNOSIS — I482 Chronic atrial fibrillation, unspecified: Secondary | ICD-10-CM | POA: Diagnosis not present

## 2019-10-05 DIAGNOSIS — I729 Aneurysm of unspecified site: Secondary | ICD-10-CM | POA: Diagnosis not present

## 2019-10-05 DIAGNOSIS — N183 Chronic kidney disease, stage 3 unspecified: Secondary | ICD-10-CM | POA: Diagnosis not present

## 2019-10-05 LAB — BASIC METABOLIC PANEL
Anion gap: 10 (ref 5–15)
BUN: 50 mg/dL — ABNORMAL HIGH (ref 8–23)
CO2: 28 mmol/L (ref 22–32)
Calcium: 7.5 mg/dL — ABNORMAL LOW (ref 8.9–10.3)
Chloride: 105 mmol/L (ref 98–111)
Creatinine, Ser: 2.18 mg/dL — ABNORMAL HIGH (ref 0.61–1.24)
GFR calc Af Amer: 30 mL/min — ABNORMAL LOW (ref >60)
GFR calc non Af Amer: 26 mL/min — ABNORMAL LOW (ref >60)
Glucose, Bld: 78 mg/dL (ref 70–99)
Potassium: 3.9 mmol/L (ref 3.5–5.1)
Sodium: 143 mmol/L (ref 135–145)

## 2019-10-05 MED ORDER — LIDOCAINE HCL 1 % IJ SOLN
INTRAMUSCULAR | Status: AC
  Filled 2019-10-05: qty 20

## 2019-10-05 NOTE — Progress Notes (Signed)
Request to IR for therapeutic left thoracentesis. Patient currently on BiPAP.  Limited chest US shows small left pleural effusion which is potentially amenable to percutaneous drainage. Given the patient's tenuous respiratory status and the small amount of fluid, pre-procedure images were reviewed with ordering physician (Dr. Sharyon Medicus) prior to proceeding and request was made not to proceed with thoracentesis at this time.  No procedure performed. Images available for review in imaging section of Epic.   Please call IR with questions or concerns.  Lynnette Caffey, PA-C

## 2019-10-05 NOTE — Progress Notes (Addendum)
Pulmonary Critical Care Medicine [facility] [place]   PULMONARY CRITICAL CARE SERVICE  PROGRESS NOTE  Date of Service: 10/05/2019  Donald Alvarado  WUJ:[id number]  DOB: 01-15-1929   DOA: 09/22/2019  Referring Physician: Carron Curie, MD  HPI: Donald Alvarado is a 86 y.o. male seen for follow up of Acute on Chronic Respiratory Failure.  Patient was supposed to have a thoracentesis done as a possible bridge to getting the patient's home however interventional radiology came up to see the patient and there was not really enough fluid there to be withdrawn and the risk would be too great as the patient's respiratory status is already compromised.  Patient is on BiPAP which has been pretty much continuous 16/8 and has been requiring 100% FiO2.  The family would like to take him home but it is unlikely that he will survive the transport from hospital to home case management is involved.  Medications: Reviewed on Rounds  Physical Exam:  Vitals: Temperature 96.7 pulse 71 respiratory 20 blood pressure is 105/58 saturations 97%  Ventilator Settings BiPAP 16/8 FiO2 100%  . General: Comfortable at this time . Eyes: Grossly normal lids, irises & conjunctiva . ENT: grossly tongue is normal . Neck: no obvious mass . Cardiovascular: S1 S2 normal no gallop . Respiratory: Coarse breath sounds diminished . Abdomen: soft . Skin: no rash seen on limited exam . Musculoskeletal: not rigid . Psychiatric:unable to assess . Neurologic: no seizure no involuntary movements         Lab Data:   Basic Metabolic Panel: Recent Labs  Lab 10/01/19 0504 10/02/19 1418 10/04/19 0734 10/05/19 0624  NA 143 143 144 143  K 3.9 3.9 3.2* 3.9  CL 104 106 106 105  CO2 28 29 30 28   GLUCOSE 77 87 87 78  BUN 38* 38* 37* 50*  CREATININE 1.63* 1.41* 1.55* 2.18*  CALCIUM 8.0* 7.9* 7.5* 7.5*  MG 1.8  --  2.0  --     ABG: No results for input(s): PHART, PCO2ART, PO2ART, HCO3, O2SAT in the last 168  hours.  Liver Function Tests: No results for input(s): AST, ALT, ALKPHOS, BILITOT, PROT, ALBUMIN in the last 168 hours. No results for input(s): LIPASE, AMYLASE in the last 168 hours. No results for input(s): AMMONIA in the last 168 hours.  CBC: Recent Labs  Lab 10/01/19 0504 10/04/19 0734  WBC 15.6* 12.7*  HGB 10.9* 11.2*  HCT 34.0* 34.8*  MCV 97.4 98.3  PLT 161 168    Cardiac Enzymes: No results for input(s): CKTOTAL, CKMB, CKMBINDEX, TROPONINI in the last 168 hours.  BNP (last 3 results) No results for input(s): BNP in the last 8760 hours.  ProBNP (last 3 results) No results for input(s): PROBNP in the last 8760 hours.  Radiological Exams: DG CHEST PORT 1 VIEW  Result Date: 10/05/2019 CLINICAL DATA:  Heart failure. EXAM: PORTABLE CHEST 1 VIEW COMPARISON:  10/04/2019. FINDINGS: NG tube in stable position. Prior CABG. Stable cardiomegaly. Diffuse bilateral pulmonary infiltrates/edema again noted. Slight improvement in aeration from prior exam. Bilateral pleural effusions again noted. Left pleural effusion has improved from prior exam. No pneumothorax. IMPRESSION: 1.  NG tube in stable position. 2.  Prior CABG.  Stable cardiomegaly. 3. Diffuse bilateral pulmonary infiltrates/edema again noted. Slight improvement in aeration from prior exam. Bilateral pleural effusions again noted. Left pleural effusion has improved from prior exam. Electronically Signed   By: 12/02/2019  Register   On: 10/05/2019 07:33   DG CHEST PORT 1 VIEW  Result Date: 10/04/2019 CLINICAL DATA:  Pneumonia EXAM: PORTABLE CHEST 1 VIEW COMPARISON:  The October 02, 2019 FINDINGS: There is a nasogastric tube with tip and side port below the diaphragm. There is patchy airspace opacity throughout the lungs bilaterally, similar to 1 day prior. There are pleural effusions bilaterally, larger on the left than on the right. No new opacity evident. There is cardiomegaly with pulmonary vascularity within normal limits. There is  evidence of previous coronary artery bypass grafting. There is aortic atherosclerosis. No adenopathy. No bone lesions. IMPRESSION: Nasogastric tube present. Multifocal airspace opacity, similar to 2 days prior. Question pneumonia versus edema. Both entities may be present concurrently. There is stable cardiomegaly. Postoperative change noted. Aortic Atherosclerosis (ICD10-I70.0). Except for the presence of the nasogastric tube, study essentially stable compared to 2 days prior. Electronically Signed   By: Lowella Grip III M.D.   On: 10/04/2019 07:38    Assessment/Plan Active Problems:   Acute on chronic respiratory failure with hypoxia (HCC)   Pleural effusion, left   Mycotic aneurysm (HCC)   Chronic kidney disease, stage III (moderate)   Chronic atrial fibrillation ([place])   1. Acute on chronic respiratory failure with hypoxia right now the patient is on the BiPAP and is on 100% FiO2.  Has not been tolerating any attempts at decreasing the FiO2.  Patient also was supposed to have thoracentesis which actually does not show significant fluid and so therefore was not done 2. Pleural effusion not enough fluid and high risk so therefore a thoracentesis is pending discontinued because of the high risk of complications. 3. Mycotic aneurysm prognosis poor has been followed by infectious disease 4. Chronic kidney disease continue to monitor labs 5. Chronic atrial fibrillation rate is controlled at this time   I have personally seen and evaluated the patient, evaluated laboratory and imaging results, formulated the assessment and plan and placed orders. The Patient requires high complexity decision making with multiple systems involvement.  Rounds were done with the Respiratory Therapy Director and Staff therapists and discussed with nursing staff also.  Time 35 minutes patient is critically ill in danger of cardiac arrest and death  Allyne Gee, MD [facility] Pulmonary Critical Care Medicine Sleep  Medicine

## 2019-10-06 DIAGNOSIS — N183 Chronic kidney disease, stage 3 unspecified: Secondary | ICD-10-CM | POA: Diagnosis not present

## 2019-10-06 DIAGNOSIS — I729 Aneurysm of unspecified site: Secondary | ICD-10-CM | POA: Diagnosis not present

## 2019-10-06 DIAGNOSIS — I482 Chronic atrial fibrillation, unspecified: Secondary | ICD-10-CM | POA: Diagnosis not present

## 2019-10-06 DIAGNOSIS — J9621 Acute and chronic respiratory failure with hypoxia: Secondary | ICD-10-CM | POA: Diagnosis not present

## 2019-10-06 LAB — MAGNESIUM: Magnesium: 2.3 mg/dL (ref 1.7–2.4)

## 2019-10-06 LAB — BASIC METABOLIC PANEL
Anion gap: 13 (ref 5–15)
BUN: 67 mg/dL — ABNORMAL HIGH (ref 8–23)
CO2: 27 mmol/L (ref 22–32)
Calcium: 7.6 mg/dL — ABNORMAL LOW (ref 8.9–10.3)
Chloride: 103 mmol/L (ref 98–111)
Creatinine, Ser: 2.78 mg/dL — ABNORMAL HIGH (ref 0.61–1.24)
GFR calc Af Amer: 23 mL/min — ABNORMAL LOW (ref >60)
GFR calc non Af Amer: 19 mL/min — ABNORMAL LOW (ref >60)
Glucose, Bld: 106 mg/dL — ABNORMAL HIGH (ref 70–99)
Potassium: 3.8 mmol/L (ref 3.5–5.1)
Sodium: 143 mmol/L (ref 135–145)

## 2019-10-06 NOTE — Progress Notes (Addendum)
Pulmonary Critical Care Medicine [facility] [place]   PULMONARY CRITICAL CARE SERVICE  PROGRESS NOTE  Date of Service: 10/06/2019  Donald Alvarado  GLO:[id number]  DOB: December 11, 1928   DOA: Sep 28, 2019  Referring Physician: Carron Curie, MD  HPI: Donald Alvarado is a 86 y.o. male seen for follow up of Acute on Chronic Respiratory Failure.  Today patient was on nonrebreather 100% FiO2 trying to get the patient to a point where he can be transported home according to the family's wishes.  Right now his status is somewhat tenuous saturations are looking better but we will need to monitor closely  Medications: Reviewed on Rounds  Physical Exam:  Vitals: Temperature 96.7 pulse 70 respiratory 14 blood pressure is 104/48 saturations 99%  Ventilator Settings off the BiPAP today on 100% nonrebreather  . General: Comfortable at this time . Eyes: Grossly normal lids, irises & conjunctiva . ENT: grossly tongue is normal . Neck: no obvious mass . Cardiovascular: S1 S2 normal no gallop . Respiratory: No rhonchi coarse breath sounds are noted at this time . Abdomen: soft . Skin: no rash seen on limited exam . Musculoskeletal: not rigid . Psychiatric:unable to assess . Neurologic: no seizure no involuntary movements         Lab Data:   Basic Metabolic Panel: Recent Labs  Lab 10/01/19 0504 10/02/19 1418 10/04/19 0734 10/05/19 0624 10/06/19 0552  NA 143 143 144 143 143  K 3.9 3.9 3.2* 3.9 3.8  CL 104 106 106 105 103  CO2 28 29 30 28 27   GLUCOSE 77 87 87 78 106*  BUN 38* 38* 37* 50* 67*  CREATININE 1.63* 1.41* 1.55* 2.18* 2.78*  CALCIUM 8.0* 7.9* 7.5* 7.5* 7.6*  MG 1.8  --  2.0  --  2.3    ABG: No results for input(s): PHART, PCO2ART, PO2ART, HCO3, O2SAT in the last 168 hours.  Liver Function Tests: No results for input(s): AST, ALT, ALKPHOS, BILITOT, PROT, ALBUMIN in the last 168 hours. No results for input(s): LIPASE, AMYLASE in the last 168 hours. No results for  input(s): AMMONIA in the last 168 hours.  CBC: Recent Labs  Lab 10/01/19 0504 10/04/19 0734  WBC 15.6* 12.7*  HGB 10.9* 11.2*  HCT 34.0* 34.8*  MCV 97.4 98.3  PLT 161 168    Cardiac Enzymes: No results for input(s): CKTOTAL, CKMB, CKMBINDEX, TROPONINI in the last 168 hours.  BNP (last 3 results) No results for input(s): BNP in the last 8760 hours.  ProBNP (last 3 results) No results for input(s): PROBNP in the last 8760 hours.  Radiological Exams: DG CHEST PORT 1 VIEW  Result Date: 10/05/2019 CLINICAL DATA:  Heart failure. EXAM: PORTABLE CHEST 1 VIEW COMPARISON:  10/04/2019. FINDINGS: NG tube in stable position. Prior CABG. Stable cardiomegaly. Diffuse bilateral pulmonary infiltrates/edema again noted. Slight improvement in aeration from prior exam. Bilateral pleural effusions again noted. Left pleural effusion has improved from prior exam. No pneumothorax. IMPRESSION: 1.  NG tube in stable position. 2.  Prior CABG.  Stable cardiomegaly. 3. Diffuse bilateral pulmonary infiltrates/edema again noted. Slight improvement in aeration from prior exam. Bilateral pleural effusions again noted. Left pleural effusion has improved from prior exam. Electronically Signed   By: 12/02/2019  Register   On: 10/05/2019 07:33   IR 12/03/2019 CHEST  Result Date: 10/05/2019 CLINICAL DATA:  86 year old male with pleural effusion and respiratory failure EXAM: CHEST ULTRASOUND COMPARISON:  Chest x-ray same day FINDINGS: Limited ultrasound of the chest demonstrates small volume pleural  fluid. IMPRESSION: Ultrasound survey demonstrates small volume pleural fluid. Thoracentesis not performed at this time given the patient's respiratory status Electronically Signed   By: Corrie Mckusick D.O.   On: 10/05/2019 11:47    Assessment/Plan Active Problems:   Acute on chronic respiratory failure with hypoxia (HCC)   Pleural effusion, left   Mycotic aneurysm (HCC)   Chronic kidney disease, stage III (moderate)   Chronic atrial  fibrillation ([place])   1. Acute on chronic respiratory failure with hypoxia continue with oxygen therapy and supportive care patient is on 100% FiO2 2. Pleural effusion very small amount effusion not drained 3. Mycotic aneurysm treated according to the recommendations of ID 4. Chronic kidney disease follow labs 5. Chronic atrial fibrillation rate controlled   I have personally seen and evaluated the patient, evaluated laboratory and imaging results, formulated the assessment and plan and placed orders. The Patient requires high complexity decision making with multiple systems involvement.  Rounds were done with the Respiratory Therapy Director and Staff therapists and discussed with nursing staff also.  Donald Gee, MD [facility] Pulmonary Critical Care Medicine Sleep Medicine

## 2019-10-07 DIAGNOSIS — J9621 Acute and chronic respiratory failure with hypoxia: Secondary | ICD-10-CM | POA: Diagnosis not present

## 2019-10-07 DIAGNOSIS — N183 Chronic kidney disease, stage 3 unspecified: Secondary | ICD-10-CM | POA: Diagnosis not present

## 2019-10-07 DIAGNOSIS — I729 Aneurysm of unspecified site: Secondary | ICD-10-CM | POA: Diagnosis not present

## 2019-10-07 DIAGNOSIS — I482 Chronic atrial fibrillation, unspecified: Secondary | ICD-10-CM | POA: Diagnosis not present

## 2019-10-07 LAB — CULTURE, RESPIRATORY W GRAM STAIN

## 2019-11-01 NOTE — Progress Notes (Signed)
PROGRESS NOTE    Donald Alvarado  LYY:[id number] DOB: 02/05/1929 DOA: 09/27/2019    Brief Narrative:  Donald Alvarado an 86 y.o.malewith history of dementia, hypertension, atrial fibrillation, hyperlipidemia, hypothyroidism, peripheral arterial disease, bilateral common femoral aneurysms status post prior open repair of the ruptured CFA with PTFE bypass April 16, 2019 who transferred from [facility] on 08/07/2019 with right groin bleeding. Patient was taken to the OR on 08/08/2019 and underwent right groin incision and drainage followed by repeat I&D with wound VAC placement on 08/12/2019. He was found with AFB positive culture. He had a hematoma and persistent bleeding from the postoperative wound site. He was taken to the OR on 08/14/2019 for right groin exploration due to bleeding. Sartorius flap coverage was done at that time. Infectious disease was consulted for concern of infected mycotic arterial aneurysm. He was initially treated with Zosyn and vancomycin. He also had other symptoms of sore throat and rhinorrhea. Covid 19 test was negative on admission on 08/07/2019. Patient was transferred to the ICU on 08/17/2019 following rapid response event for increased bleeding from the right groin surgical site. He also had atrial fibrillation with RVR. He was taken back to the OR on 08/18/2019 and underwent redo right external iliac artery to superficial femoral artery bypass with reversed saphenous vein and jump graft from femoral vein to SFA with reversed right greater saphenous vein. He received multiple blood product transfusion for the acute blood loss anemia. Regarding his A. fib with RVR cardiology was consulted and he was transitioned from amiodarone drip to oral. Patient also had dysphagia. He reportedly failed multiple modified barium swallow studies and Dobbhoff tube was placed. He developed left-sided pleural effusion requiring thoracentesis on 09/05/2019. He is on  oxygen by nasal cannula. He also had fluid overload and diuresed with Lasix which was later stopped due to acute kidney injury. Palliative care was consulted. Family decided against placement of permanent feeding tube. Patient on treatment with azithromycin, cefoxitin, tigecycline.  10/13/19: He is currently on 100% nonrebreather.  Confused.  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  Chest x-ray showing diffuse bilateral pulmonary infiltrates.  Concern for pneumonia/edema.  He is currently on 100% nonrebreather.  Pulmonary following.  Unfortunately high suspicion for ongoing aspiration which is contributing to the worsening respiratory failure.  Already on antimicrobials for his right groin wound infection.  Right groin wound: Patient initially had open repair of ruptured CFA with PTFE bypass on 04/16/2019 after which she developed complications with right groin bleeding and abscess. He is status post multiple surgeries on 08/08/2019, 08/12/2019 and subsequently again on 08/14/2019. Wound cultures showed Mycobacterium abscessus. Currently the wound is closed. On treatment with azithromycin, cefoxitin, tigecycline. 2019-10-13: His surgical incision is healing, previously had a scab. Continue local wound care.  Also has some  swelling on the medial aspect of the same thigh likely secondary to hematoma.  Right groin abscess with infection: Status post multiple debridement surgeries as mentioned above. Cultures reportedly showed Mycobacterium abscesses. He is status post multiple debridements. Antibiotics as mentioned above.   Infected mycotic aneurysm: As mentioned above he is status post multiple surgical debridements. He was already treated with broad-spectrum IV vancomycin, Zosyn. Cultures at the outside facility showed Mycobacterium abscesses. Therefore on antibiotics as mentioned above.  Severe peripheral vascular disease: Management per primary team.  Dysphagia: Patient failed swallow evaluation at the outside facility.  Previously had Dobbhoff tube.  After swallow evaluation here he was started on diet.  However, previously noted to have some cough and gurgling.  Very high suspicion for aspiration.  Chest x-ray showed bilateral infiltrates concerning for pneumonia/edema. Unfortunately due to his dysphagia he is very high risk for ongoing aspiration and worsening respiratory failure from aspiration pneumonia.  Left-sided effusion: Patient already had thoracentesis at the outside facility.    Atrial fibrillation: Patient at this time is bradycardic.  Acute on chronic kidney disease: Patient likely has stage III CKD. Currently creatinine has been worsening.  Dementia/debility: Continue supportive therapy per the primary team.  Bradycardia: Continue medications and management per the primary team.  Due to his complex medical problems he is high risk for worsening and decompensation.  Per the primary after family discussion, family opting for comfort care.  Subjective: He is currently on 100% nonrebreather.  Confused, lethargic.  Not following commands.  He is also having bradycardia.  Objective: Temperature 97.8, blood pressure 130/48, heart rate 35, respiratory rate 20, 100%  nonrebreather.  Examination:  General: On 100% nonrebreather, lethargic Head:Atraumatic, normocephalic Eyes: PERLA, EOMI, irises appear normal, anicteric sclera  ENMT: external ears and nose appear normal,hard of hearing Neck:neck appears normal, no masses  CVS: S1-S2, no murmur Respiratory:Decreased breath sound lower lobes, rhonchi, no wheezing Abdomen: soft nontender, nondistended, normal bowel sounds  Musculoskeletal:Healing surgical incision in the right groin area.Has some swelling on the medial aspect,noerythema, no tenderness Neuro:He has history of dementia, lethargic. Psych:Has dementia, confused, unable to do a neurologic exam at this time. Skin:Findings as mentioned above, no rashes     Data Reviewed: I have personally reviewed following labs and imaging studies  CBC: Recent Labs  Lab 10/01/19 0504 10/04/19 0734  WBC 15.6* 12.7*  HGB 10.9* 11.2*  HCT 34.0* 34.8*  MCV 97.4 98.3  PLT 161 099   Basic Metabolic Panel: Recent Labs  Lab 10/01/19 0504 10/02/19 1418 10/04/19 0734 10/05/19 0624 10/06/19 0552  NA 143 143 144 143 143  K 3.9 3.9 3.2* 3.9 3.8  CL 104 106 106 105 103  CO2 28 29 30 28 27   GLUCOSE 77 87 87 78 106*  BUN 38* 38* 37* 50* 67*  CREATININE 1.63* 1.41* 1.55* 2.18* 2.78*  CALCIUM 8.0* 7.9* 7.5* 7.5* 7.6*  MG 1.8  --  2.0  --  2.3   GFR: CrCl cannot be calculated (Unknown ideal weight.). Liver Function Tests: No results for input(s): AST, ALT, ALKPHOS, BILITOT, PROT, ALBUMIN in the last 168 hours. No results for input(s): LIPASE, AMYLASE in the last 168 hours. No results for input(s): AMMONIA in the last 168 hours. Coagulation Profile: No results for input(s): INR, PROTIME in the last 168 hours. Cardiac Enzymes: No results for input(s): CKTOTAL, CKMB, CKMBINDEX, TROPONINI in the last 168 hours. BNP (last 3 results) No results for input(s): PROBNP in the last 8760 hours. HbA1C: No results for input(s): HGBA1C in  the last 72 hours. CBG: No results for input(s):  GLUCAP in the last 168 hours. Lipid Profile: No results for input(s): CHOL, HDL, LDLCALC, TRIG, CHOLHDL, LDLDIRECT in the last 72 hours. Thyroid Function Tests: No results for input(s): TSH, T4TOTAL, FREET4, T3FREE, THYROIDAB in the last 72 hours. Anemia Panel: No results for input(s): VITAMINB12, FOLATE, FERRITIN, TIBC, IRON, RETICCTPCT in the last 72 hours. Sepsis Labs: No results for input(s): PROCALCITON, LATICACIDVEN in the last 168 hours.  Recent Results (from the past 240 hour(s))  Culture, respiratory (non-expectorated)     Status: None   Collection Time: 10/01/19  2:25 PM   Specimen: Tracheal Aspirate; Respiratory  Result Value Ref Range Status   Specimen Description TRACHEAL ASPIRATE  Final   Special Requests NONE  Final   Gram Stain   Final    FEW WBC PRESENT, PREDOMINANTLY PMN MODERATE SQUAMOUS EPITHELIAL CELLS PRESENT MODERATE YEAST RARE GRAM POSITIVE COCCI Performed at [facility], [address], [place], [place] [address]    Culture MODERATE CANDIDA ALBICANS  Final   Report Status 10/16/2019 FINAL  Final  Culture, Urine     Status: None   Collection Time: 10/01/19  2:45 PM   Specimen: Urine, Random  Result Value Ref Range Status   Specimen Description URINE, RANDOM  Final   Special Requests NONE  Final   Culture   Final    NO GROWTH Performed at [facility], [address], [place], [place] [address]    Report Status 10/02/2019 FINAL  Final  SARS CORONAVIRUS 2 (TAT 6-24 HRS) Nasopharyngeal Nasopharyngeal Swab     Status: None   Collection Time: 10/04/19 12:40 PM   Specimen: Nasopharyngeal Swab  Result Value Ref Range Status   SARS Coronavirus 2 NEGATIVE NEGATIVE Final    Comment: (NOTE) SARS-CoV-2 target nucleic acids are NOT DETECTED. The SARS-CoV-2 RNA is generally detectable in upper and lower respiratory specimens during the acute phase of infection. Negative results do not preclude  SARS-CoV-2 infection, do not rule out co-infections with other pathogens, and should not be used as the sole basis for treatment or other patient management decisions. Negative results must be combined with clinical observations, patient history, and epidemiological information. The expected result is Negative. Fact Sheet for Patients: HairSlick.no Fact Sheet for Healthcare Providers: quierodirigir.com This test is not yet approved or cleared by the [place] FDA and  has been authorized for detection and/or diagnosis of SARS-CoV-2 by FDA under an Emergency Use Authorization (EUA). This EUA will remain  in effect (meaning this test can be used) for the duration of the COVID-19 declaration under Section 56 4(b)(1) of the Act, 21 U.S.C. section 360bbb-3(b)(1), unless the authorization is terminated or revoked sooner. Performed at [facility], [address] [place], [place] [address]          Radiology Studies: No results found.      Scheduled Meds: Please see MAR   Vonzella Nipple, MD 10/31/2019, 2:43 PM

## 2019-11-01 NOTE — Progress Notes (Signed)
Pulmonary Critical Care Medicine [facility]   PULMONARY CRITICAL CARE SERVICE  PROGRESS NOTE  Date of Service: 10/02/2019  Donald Alvarado  SAY:[id number]  DOB: [birth year]   DOA: October 14, 2019  Referring Physician: Merton Border, MD  HPI: Donald Alvarado is a 86 y.o. male seen for follow up of Acute on Chronic Respiratory Failure. Patient is on 100% nonrebreather.  The family has been talking about withdrawing and keeping him comfortable.  Patient is right now having low heart rate around 35bpm  Medications: Reviewed on Rounds  Physical Exam:  Vitals: Temperature 97.8 pulse was down to 35 respiratory rate 20 blood pressure is 130/48  Ventilator Settings on 100% nonrebreather  . General: Comfortable at this time . Eyes: Grossly normal lids, irises & conjunctiva . ENT: grossly tongue is normal . Neck: no obvious mass . Cardiovascular: S1 S2 normal no gallop . Respiratory: No rhonchi no rales . Abdomen: soft . Skin: no rash seen on limited exam . Musculoskeletal: not rigid . Psychiatric:unable to assess . Neurologic: no seizure no involuntary movements         Lab Data:   Basic Metabolic Panel: Recent Labs  Lab 10/01/19 0504 10/02/19 1418 10/04/19 0734 10/05/19 0624 10/06/19 0552  NA 143 143 144 143 143  K 3.9 3.9 3.2* 3.9 3.8  CL 104 106 106 105 103  CO2 28 29 30 28 27   GLUCOSE 77 87 87 78 106*  BUN 38* 38* 37* 50* 67*  CREATININE 1.63* 1.41* 1.55* 2.18* 2.78*  CALCIUM 8.0* 7.9* 7.5* 7.5* 7.6*  MG 1.8  --  2.0  --  2.3    ABG: No results for input(s): PHART, PCO2ART, PO2ART, HCO3, O2SAT in the last 168 hours.  Liver Function Tests: No results for input(s): AST, ALT, ALKPHOS, BILITOT, PROT, ALBUMIN in the last 168 hours. No results for input(s): LIPASE, AMYLASE in the last 168 hours. No results for input(s): AMMONIA in the last 168 hours.  CBC: Recent Labs  Lab 10/01/19 0504 10/04/19 0734  WBC 15.6* 12.7*  HGB 10.9* 11.2*  HCT 34.0* 34.8*   MCV 97.4 98.3  PLT 161 168    Cardiac Enzymes: No results for input(s): CKTOTAL, CKMB, CKMBINDEX, TROPONINI in the last 168 hours.  BNP (last 3 results) No results for input(s): BNP in the last 8760 hours.  ProBNP (last 3 results) No results for input(s): PROBNP in the last 8760 hours.  Radiological Exams: IR US CHEST  Result Date: 10/05/2019 CLINICAL DATA:  86 year old male with pleural effusion and respiratory failure EXAM: CHEST ULTRASOUND COMPARISON:  Chest x-ray same day FINDINGS: Limited ultrasound of the chest demonstrates small volume pleural fluid. IMPRESSION: Ultrasound survey demonstrates small volume pleural fluid. Thoracentesis not performed at this time given the patient's respiratory status Electronically Signed   By: Corrie Mckusick D.O.   On: 10/05/2019 11:47    Assessment/Plan Active Problems:   Acute on chronic respiratory failure with hypoxia (HCC)   Pleural effusion, left   Mycotic aneurysm (HCC)   Chronic kidney disease, stage III (moderate)   Chronic atrial fibrillation ([place])   1. Acute on chronic respiratory failure with hypoxia the plan is to continue on the supportive care need to have a goals of care meeting with the family.  I will discuss with the primary care team.  Patient is not likely to survive this hospitalization now is also having bradycardia with poor prognostic sign.  I spoke with the patient's wife on the telephone and they  are ready to change him over to comfort care measures.  They would however like to come in and see him one final time. 2. Left-sided pleural effusion minimal fluid noted on the last ultrasound 3. Mycotic aneurysm no change 4. Chronic kidney disease stage III patient is at baseline 5. Chronic atrial fibrillation rate is controlled we will continue with supportive care   I have personally seen and evaluated the patient, evaluated laboratory and imaging results, formulated the assessment and plan and placed orders. The  Patient requires high complexity decision making with multiple systems involvement.  Time 35 minutes extended conversation on the phone Rounds were done with the Respiratory Therapy Director and Staff therapists and discussed with nursing staff also.  Yevonne Pax, MD [facility] Pulmonary Critical Care Medicine Sleep Medicine

## 2019-11-01 DEATH — deceased

## 2021-08-17 IMAGING — DX DG ABDOMEN 1V
1 series · 1 of 1 positions shown · non-contrast
Comparison: 09/15/2019

CLINICAL DATA: NG tube placement.

EXAM:
ABDOMEN - 1 VIEW

[abdomen kub]
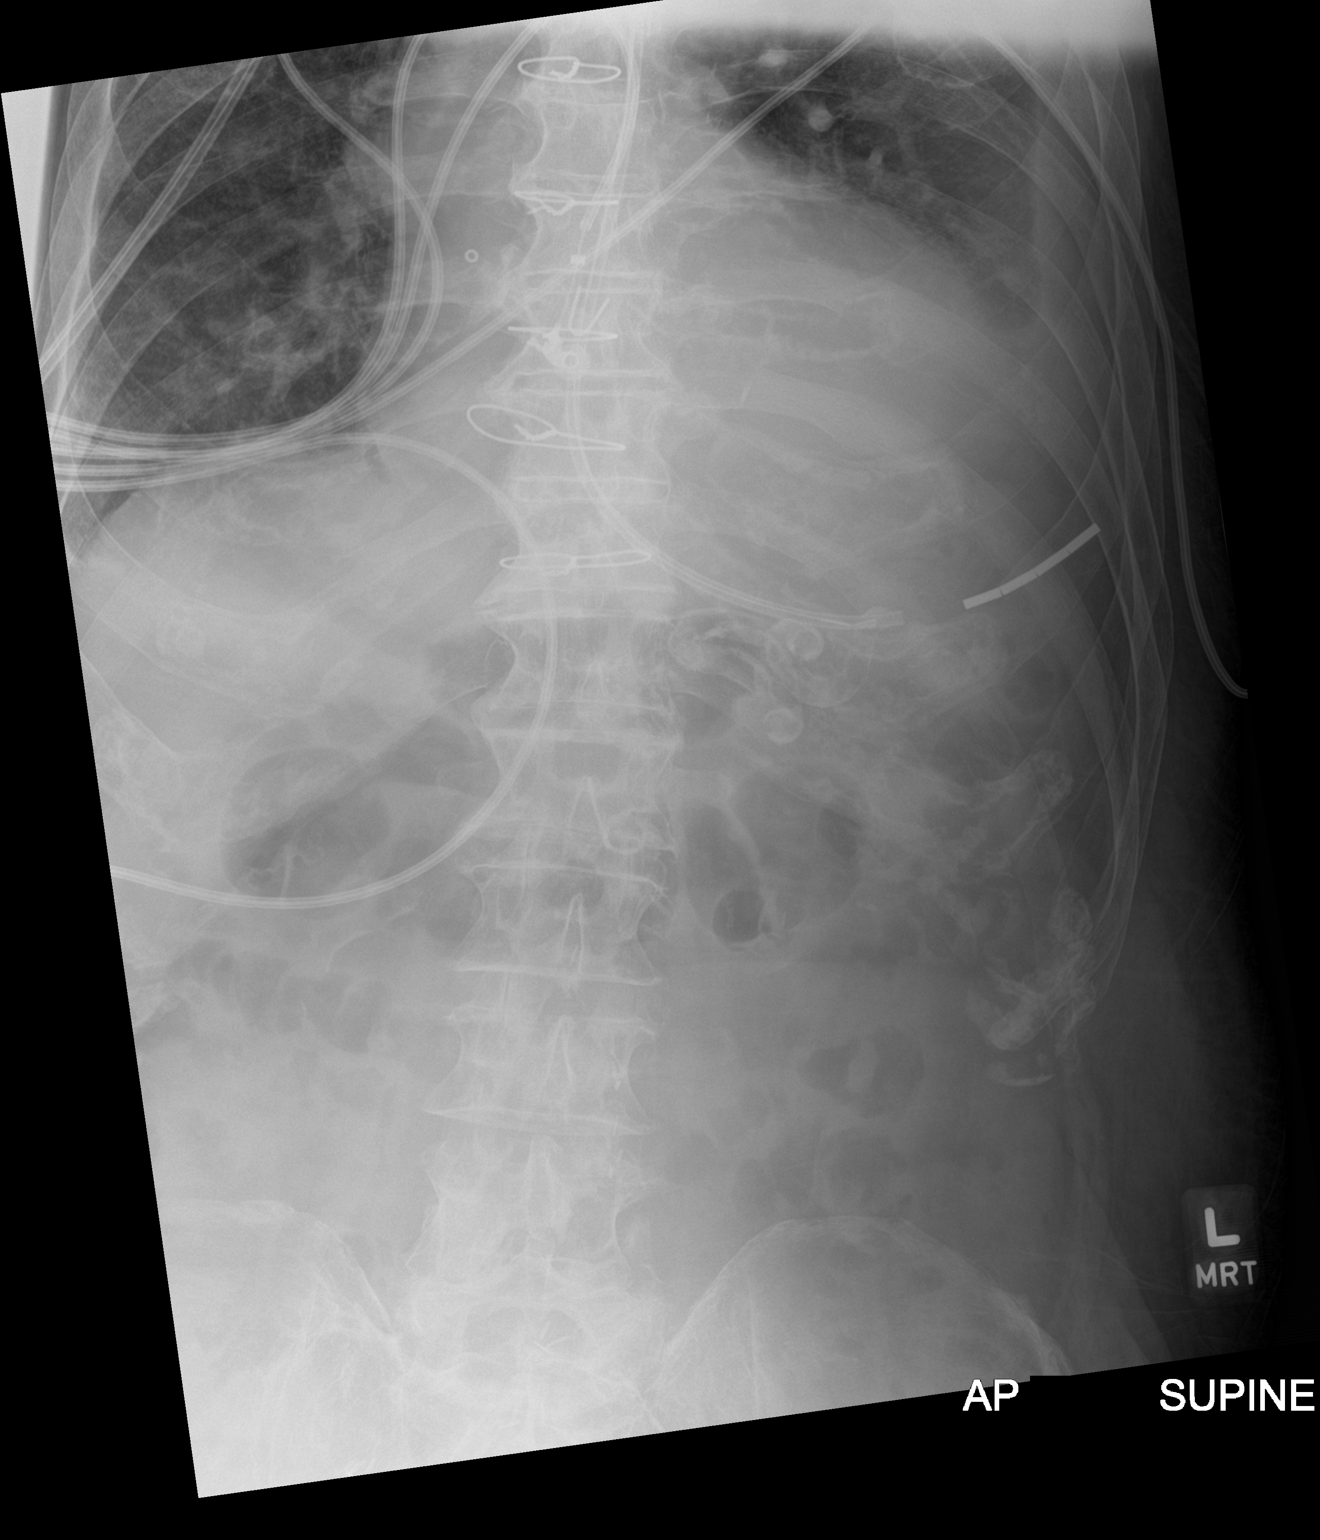

[1 of 1 positions shown; findings below may reference images not displayed]

FINDINGS: Nonspecific bowel gas pattern. Feeding tube tip overlies the lateral
aspect of the proximal stomach. No NG tube visualized on this exam.
Small left pleural effusion noted.
IMPRESSION: 1. Feeding tube tip is positioned in the proximal stomach.
2. No NG tube visualized on this study.
3. Left pleural effusion.

## 2021-08-24 IMAGING — DX DG CHEST 1V PORT
1 series · 1 of 1 positions shown · non-contrast
Comparison: 09/15/2019

CLINICAL DATA: Oxygen desaturation

EXAM:
PORTABLE CHEST 1 VIEW

[chest ap]
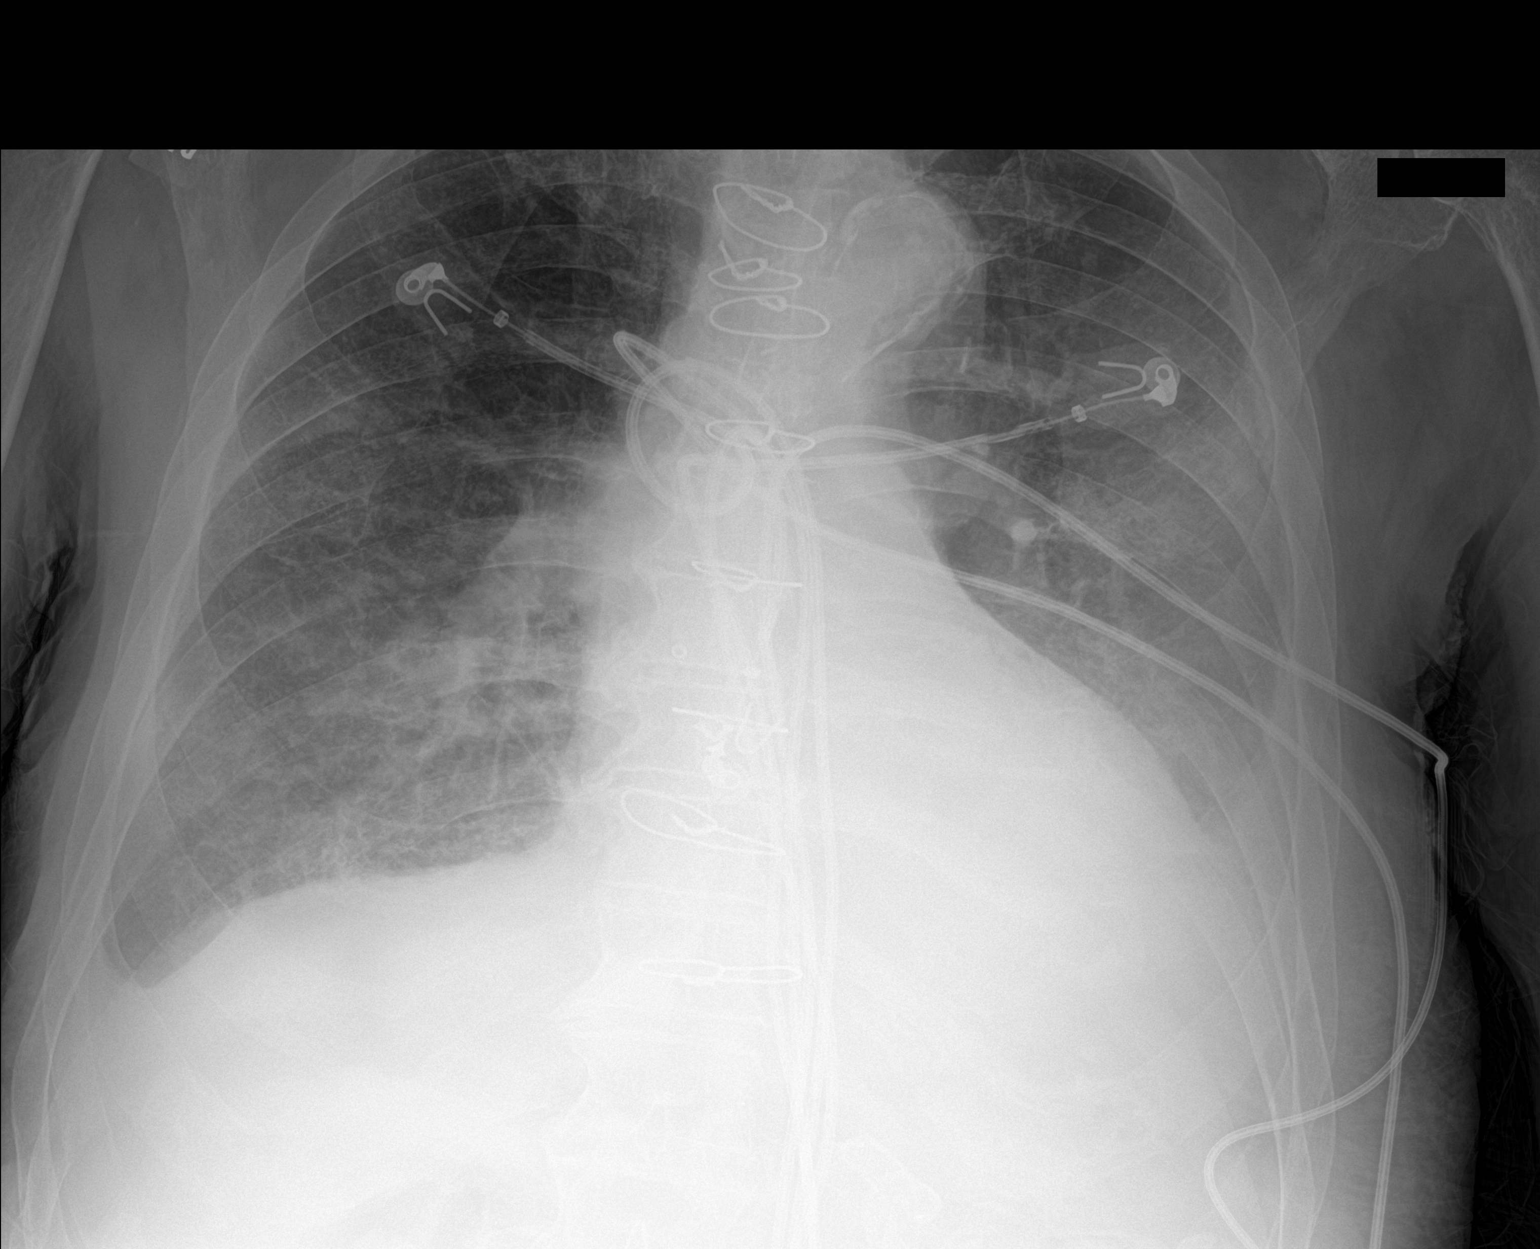

[1 of 1 positions shown; findings below may reference images not displayed]

FINDINGS: Cardiac shadow is mildly enlarged but stable. Aortic calcifications
are seen. Postsurgical changes are again noted. Bilateral pleural
effusions left greater than right are again seen and stable given
some variation in positioning. Mild increased density is noted over
the lower lung fields likely related to posteriorly layering
effusions.
IMPRESSION: Bilateral pleural effusions left greater than right. No other focal
abnormality is noted.

## 2021-08-26 IMAGING — DX DG ABDOMEN 1V
2 series · 2 of 2 positions shown · non-contrast
Comparison: 09/26/2019

CLINICAL DATA: Check gastric catheter placement

EXAM:
ABDOMEN - 1 VIEW

[abdomen kub (1 of 2)]
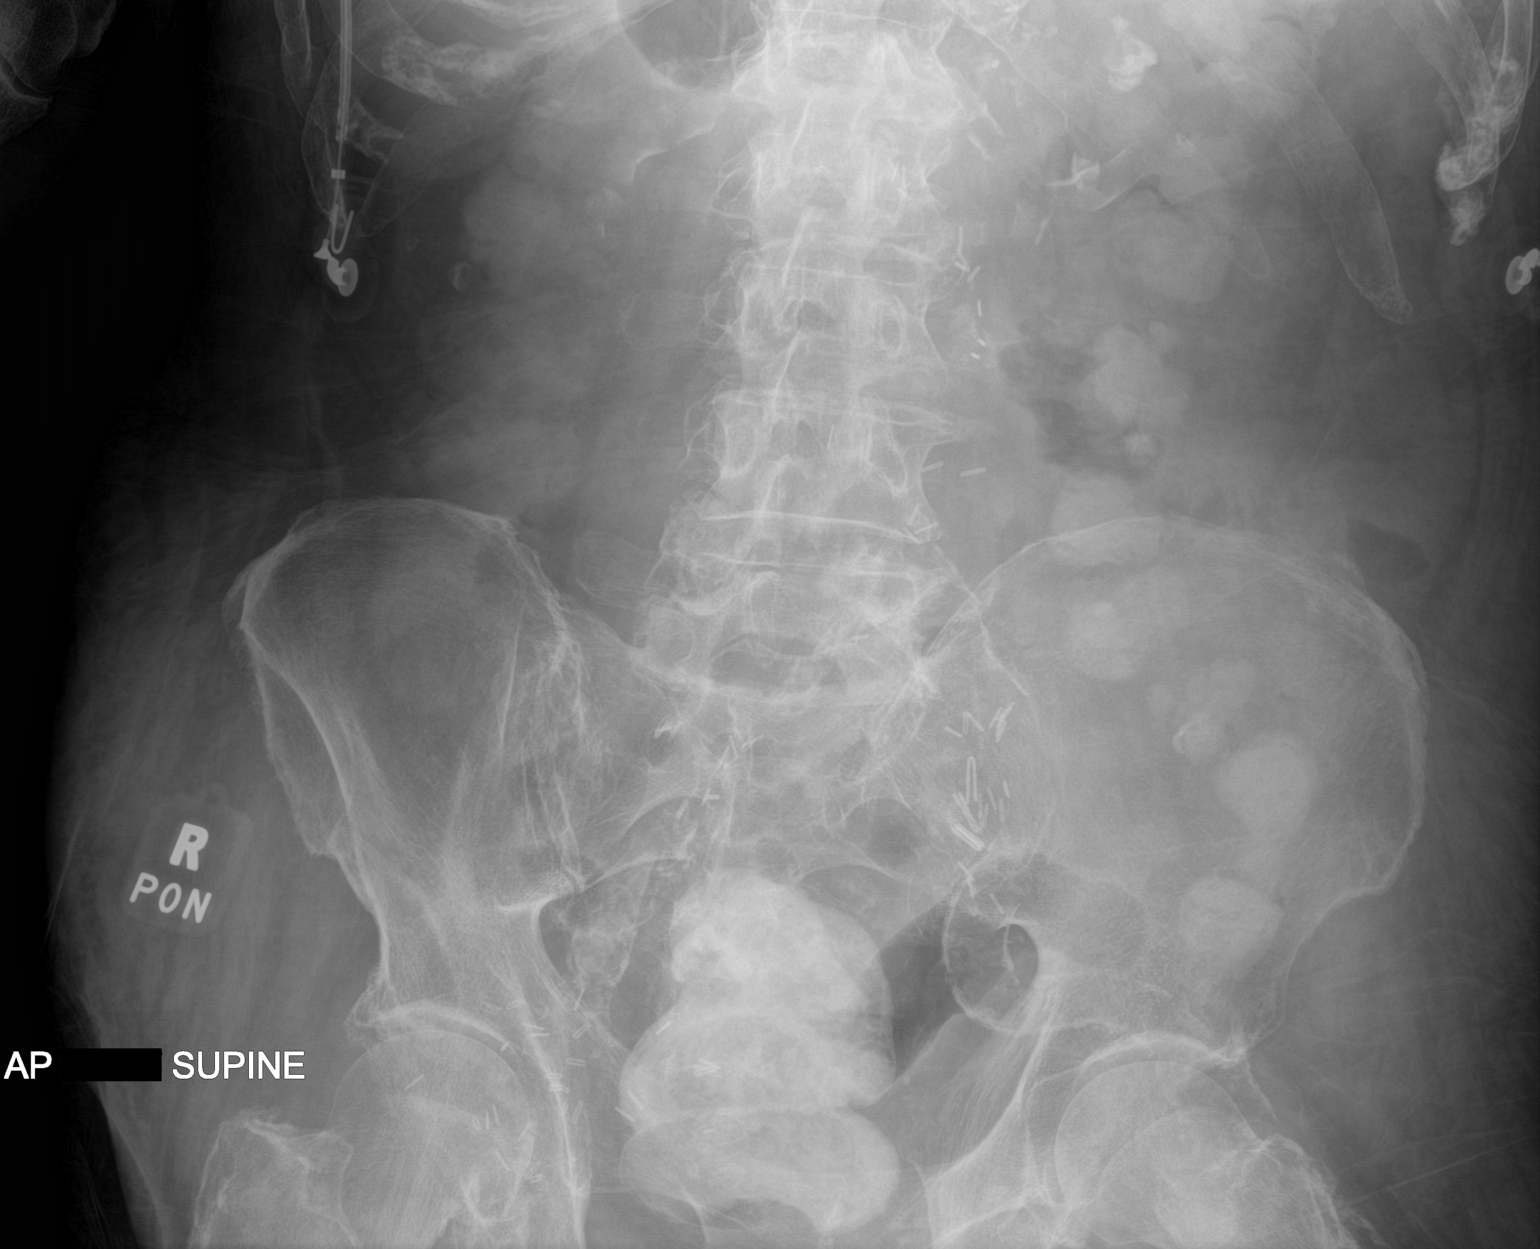

[abdomen kub (2 of 2)]
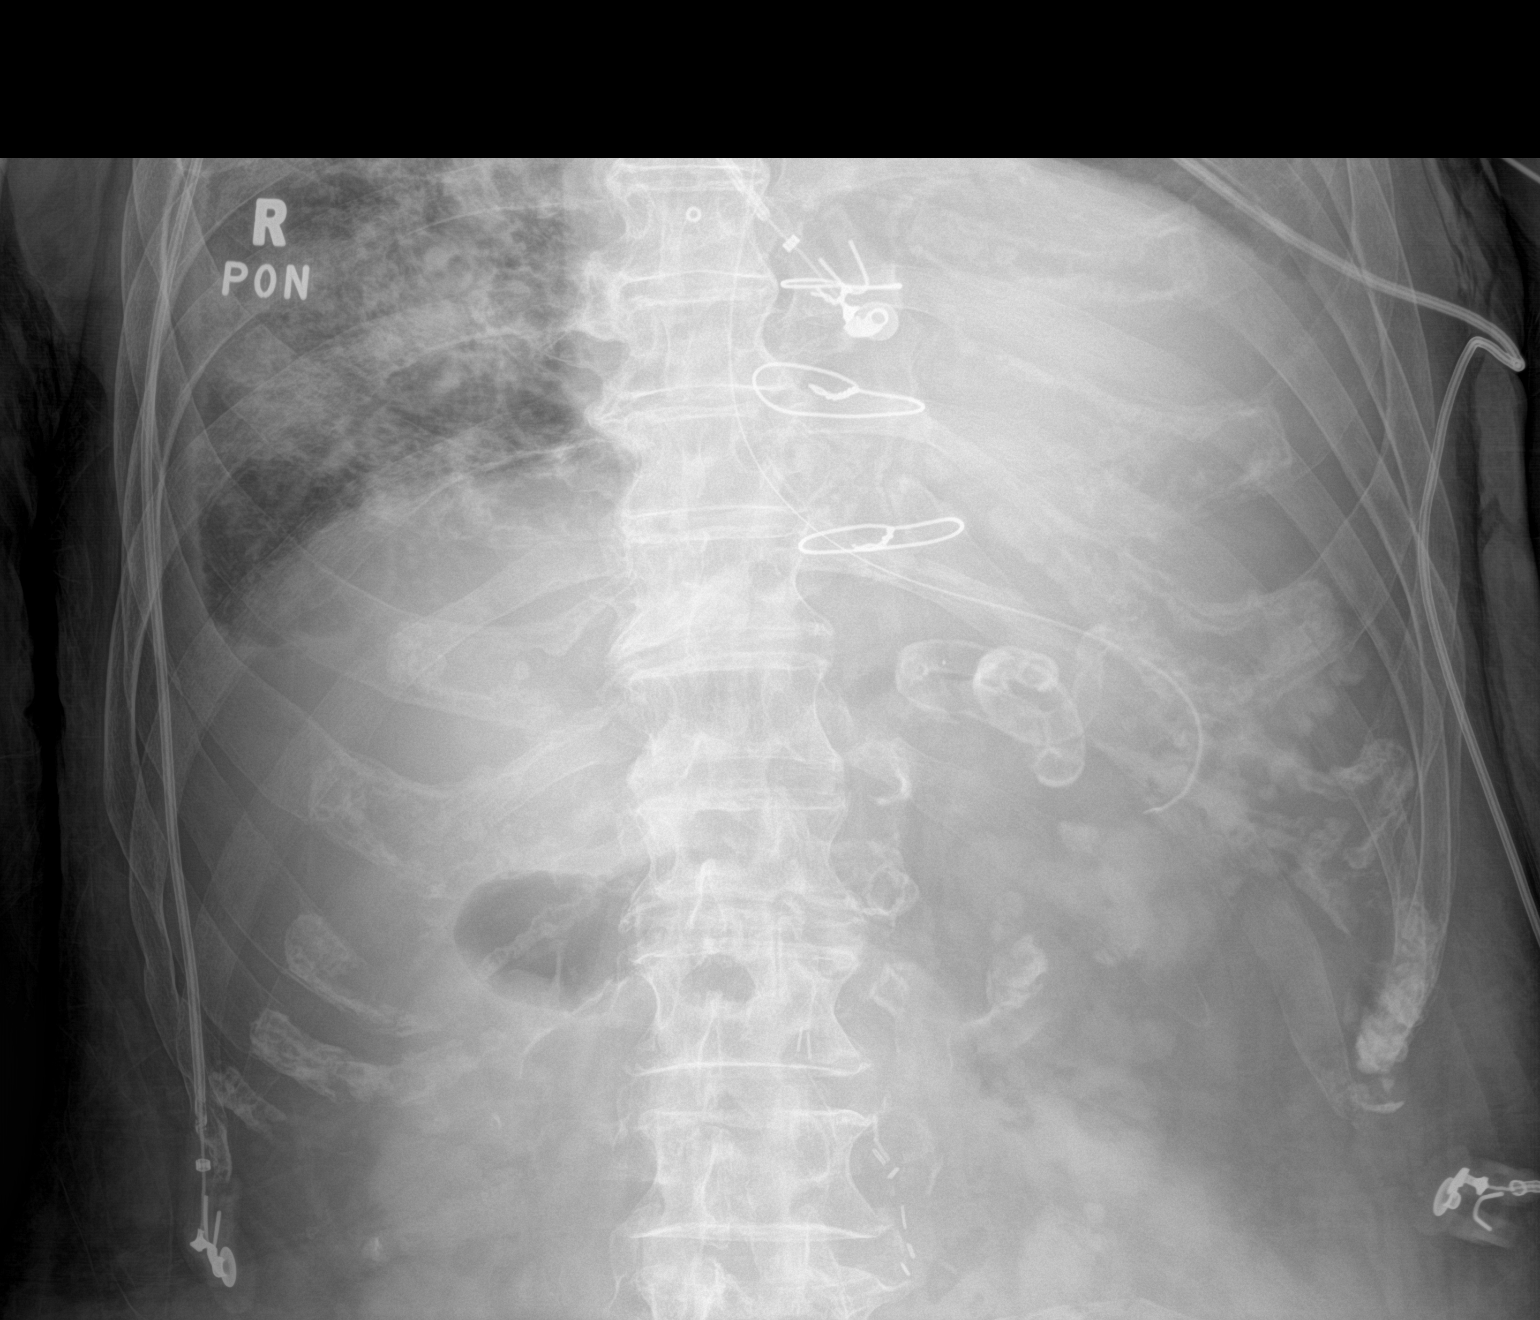

[2 of 2 positions shown; findings below may reference images not displayed]

FINDINGS: Gastric catheter is noted within the stomach. No obstructive changes
are seen. Patchy infiltrate is noted throughout the right lung.
IMPRESSION: Gastric catheter within the stomach. Patchy infiltrate throughout
the right lung stable from recent chest x-ray.
